# Patient Record
Sex: Female | Born: 1970 | Race: White | Hispanic: No | Smoking: Current every day smoker
Health system: Southern US, Community
[De-identification: ages and names within clinical notes are randomized; demographics above are authoritative.]

## PROBLEM LIST (undated history)

## (undated) DIAGNOSIS — E119 Type 2 diabetes mellitus without complications: Secondary | ICD-10-CM

## (undated) DIAGNOSIS — G629 Polyneuropathy, unspecified: Secondary | ICD-10-CM

## (undated) DIAGNOSIS — M797 Fibromyalgia: Secondary | ICD-10-CM

## (undated) DIAGNOSIS — E785 Hyperlipidemia, unspecified: Secondary | ICD-10-CM

## (undated) HISTORY — DX: Type 2 diabetes mellitus without complications: E11.9

## (undated) HISTORY — DX: Fibromyalgia: M79.7

## (undated) HISTORY — DX: Polyneuropathy, unspecified: G62.9

## (undated) HISTORY — DX: Hyperlipidemia, unspecified: E78.5

## (undated) HISTORY — PX: KNEE SURGERY: SHX244

---

## 2006-12-02 ENCOUNTER — Other Ambulatory Visit: Admission: RE | Admit: 2006-12-02 | Discharge: 2006-12-02 | Payer: Self-pay | Admitting: Obstetrics and Gynecology

## 2007-05-14 ENCOUNTER — Encounter: Admission: RE | Admit: 2007-05-14 | Discharge: 2007-05-14 | Payer: Self-pay | Admitting: Obstetrics and Gynecology

## 2007-06-19 ENCOUNTER — Inpatient Hospital Stay (HOSPITAL_COMMUNITY): Admission: RE | Admit: 2007-06-19 | Discharge: 2007-06-22 | Payer: Self-pay | Admitting: Obstetrics and Gynecology

## 2007-06-19 ENCOUNTER — Encounter (INDEPENDENT_AMBULATORY_CARE_PROVIDER_SITE_OTHER): Payer: Self-pay | Admitting: Obstetrics and Gynecology

## 2007-06-23 ENCOUNTER — Encounter: Admission: RE | Admit: 2007-06-23 | Discharge: 2007-07-23 | Payer: Self-pay | Admitting: Obstetrics and Gynecology

## 2007-07-24 ENCOUNTER — Encounter: Admission: RE | Admit: 2007-07-24 | Discharge: 2007-08-23 | Payer: Self-pay | Admitting: Obstetrics and Gynecology

## 2007-08-24 ENCOUNTER — Encounter: Admission: RE | Admit: 2007-08-24 | Discharge: 2007-09-22 | Payer: Self-pay | Admitting: Obstetrics and Gynecology

## 2007-09-23 ENCOUNTER — Encounter: Admission: RE | Admit: 2007-09-23 | Discharge: 2007-10-23 | Payer: Self-pay | Admitting: Obstetrics and Gynecology

## 2007-10-24 ENCOUNTER — Encounter: Admission: RE | Admit: 2007-10-24 | Discharge: 2007-11-22 | Payer: Self-pay | Admitting: Obstetrics and Gynecology

## 2007-11-23 ENCOUNTER — Encounter: Admission: RE | Admit: 2007-11-23 | Discharge: 2007-12-23 | Payer: Self-pay | Admitting: Obstetrics and Gynecology

## 2007-12-24 ENCOUNTER — Encounter: Admission: RE | Admit: 2007-12-24 | Discharge: 2008-01-23 | Payer: Self-pay | Admitting: Obstetrics and Gynecology

## 2008-01-24 ENCOUNTER — Encounter: Admission: RE | Admit: 2008-01-24 | Discharge: 2008-02-20 | Payer: Self-pay | Admitting: Obstetrics and Gynecology

## 2011-04-10 NOTE — H&P (Signed)
Victoria Mcdonald, CHAR              ACCOUNT NO.:  [id number]   MEDICAL RECORD NO.:  [id number]           PATIENT TYPE:   LOCATION:                                 FACILITY:   PHYSICIAN:  Charles A. Delcambre, MDDATE OF BIRTH:  [birth year]   DATE OF ADMISSION:  06/23/2007  DATE OF DISCHARGE:                              HISTORY & PHYSICAL   HISTORY OF PRESENT ILLNESS:  The patient is to be admitted on June 23, 2007 to undergo repeat cesarean section with prior cesarean section,  declining vaginal birth after cesarean section.  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 Father of the baby was negative. She gives informed consent,  accepts risks of infection, bleeding, bowel and bladder damage, blood  products risks including hepatitis B and HIV exposure and DVT, ureteral  damage, incisional infection. All questions are answered.   PREGNANCY LABORATORY DATA:  Platelets 310,000. Initial hemoglobin 15.6.  Blood type O+, antibody screen negative. VDRL non-reactive. Rubella  immune. Hepatitis B surface antigen negative. HIV non-reactive. Quad  screen negative. First trimester screen normal. One hour Glucola 107.  Hemoglobin 13.4 at 28 weeks. RPR negative at 28 weeks. HIV, GC, and  Chlamydia are negative at 36 weeks. Group B strep negative.   PAST MEDICAL HISTORY:  Irritable bowel syndrome, GERD, and advanced  maternal age declining amniocentesis.   PAST SURGICAL HISTORY:  Knee surgery, cesarean section, and induced  abortion first trimester.   MEDICATIONS:  Nexium 40 mg daily.   ALLERGIES:  IODINE, SULFA, MORPHINE.   SOCIAL HISTORY:  No tobacco, alcohol, or drug use. Married and in a  monogamous relationship with her husband.   FAMILY HISTORY:  Father with testicular cancer. Maternal grandfather  with colon cancer. Paternal grandmother with breast cancer. Mother and  father with hypertension. Paternal grandfather with heart disease,  diabetes.  Maternal aunt with thyroid problems, and her maternal  grandmother. Otherwise, no major illnesses.   REVIEW OF SYSTEMS:  Denies fever, chills, nausea, vomiting, blurred  vision, right upper quadrant pain, bowel or bladder changes other than  some incontinence consistent with pregnancy, and no rupture of membranes  or bleeding. Occasional contraction but not regular. She notes active  fetal movement. Denies vaginal bleeding.   PHYSICAL EXAMINATION:  GENERAL:  Alert and oriented times three. No  distress.  VITAL SIGNS:  Blood pressure 110/84, respiratory rate 18, pulse 90,  afebrile, weight 218 pounds.  HEENT:  Examination grossly within normal limits.  NECK:  Supple without thyromegaly or adenopathy.  LUNGS:  Clear bilaterally.  HEART:  Regular rate and rhythm with 2/6 systolic ejection murmur at the  left sternal border.  BREAST:  No mass, tenderness, or discharge.  SKIN:  No nipple change bilaterally.  ABDOMEN:  Gravid. Fundal height 42.  She has had macrosomic baby greater  than 97th percentile about 2 weeks ago. Will be checking again next week  for fluid as well as growth.  PELVIC:  Examination deferred.  EXTREMITIES:  Minimal edema bilaterally.   ASSESSMENT:  Pregnancy to be 39 weeks and 1 day,  to be admitted for  repeat cesarean section. She does not desire tubal ligation. All  questions are answered. She gives informed consent as noted above. Plan  preoperative labs to include type and screen, RPR and CBC will be drawn.  She will remain NPO past midnight. All questions are answered and we  will proceed as outlined.      Charles A. Sydnee Cabal, MD  Electronically Signed     CAD/MEDQ  D:  06/10/2007  T:  06/10/2007  Job:  [id number]

## 2011-04-10 NOTE — Op Note (Signed)
NAMENICHOLL, ONSTOTT              ACCOUNT NO.:  [id number]   MEDICAL RECORD NO.:  [id number]          PATIENT TYPE:  INP   LOCATION:  9198                          [facility]ILITY:  [facility]   PHYSICIAN:  Charles A. Delcambre, MDDATE OF BIRTH:  Mar 18, 1971   DATE OF PROCEDURE:  06/19/2007  DATE OF DISCHARGE:                               OPERATIVE REPORT   PREOPERATIVE DIAGNOSES:  1. Intrauterine pregnancy at 38 weeks 4 days.  2. Pregnancy associated hypertension.   POSTOPERATIVE DIAGNOSES:  1. Intrauterine pregnancy at 38 weeks 4 days.  2. Pregnancy associated hypertension.   PROCEDURE:  Repeat low transverse cesarean section.   SURGEON:  Charles A. Delcambre, MD.   ASSISTANT:  None.   COMPLICATIONS:  None.   BLOOD LOSS:  500 mL.   FINDINGS:  Three-vessel cord.  Normal uterus, tubes and ovaries.  Vigorous female, Apgars 9 and 9, nuchal cord x1.  Placenta to pathology.   ANESTHESIA:  Spinal.   DESCRIPTION OF PROCEDURE:  The patient was taken to the operating room  and placed in supine position after spinal anesthetic was induced.  Anesthesia was adequate and sterile prep and drape was undertaken.  A  Pfannenstiel incision over old scar from previous cesarean section was  cut with a knife and carried down to fascia.  Fascia was incised with a  knife and Mayo scissors.  The rectus sheath was released sharply and  with the Bovie superiorly and inferiorly without damage to surrounding  structures.  Rectus muscles were sharply dissected in the midline  without damage to the bladder.  Peritoneum was entered bluntly with  finger and traction was used to extend this incision.  Bladder blade was  placed.  Vesicouterine peritoneum was incised with Metzenbaum scissors  in a smiling fashion on the lower uterine segment.  Sharp dissection was  then used to develop the bladder flap.  There was no evidence of injury  to the bladder.  A scoring incision was then used to transverse on the  uterus  with the knife to amniotomy.  There was no damage to the infant.  Traction was used vertically to open the uterus.  Fundal pressure was  applied by the operator's assistant and the baby was delivered without  difficulty.  Nuchal cord was reduced and there was a spontaneous cry  upon delivery.  Cord was cut and infant was shown to the parents and  handed off to Dr. Alison Murray who was in attendance, neonatologist.  Placenta  was manually expressed.  Uterus was externalized and the internal  surface was wiped with dry lap.  The two-layer closure with #1 chromic  was undertaken, first running locking, second running imbricating over  nonlocking.  There was one area of bleeding to the left of the incision  that was closed with figure-of-eight 2-0 Vicryl and there was a figure-  of-eight suture placed across the incision in one area to achieve  hemostasis with #1 chromic.  Irrigation was carried out.  Hemostasis was  excellent.  Uterus had been externalized for repair and was re-  internalized before irrigation.  Uterine incision was  verified to be of  good hemostasis.  Bladder flap was of good hemostasis.  Subfascial  tissue needed a small amount of cautery superiorly to achieve  hemostasis.  Fascia was then closed with #1 Vicryl running nonlocking  suture.  The subcutaneous irrigation was carried out.  Hemostasis was  excellent.  Sterile skin staples were used to close the skin.  Sterile  dressing was applied.  Sterile skin clips were used to close the skin.  Sterile dressing was applied.  Patient was taken to recovery with  physician in attendance having tolerated the procedure well.      Charles A. Sydnee Cabal, MD  Electronically Signed     CAD/MEDQ  D:  06/19/2007  T:  06/20/2007  Job:  [id number]

## 2011-04-10 NOTE — Discharge Summary (Signed)
Victoria Mcdonald, Victoria Mcdonald              ACCOUNT NO.:  [id number]   MEDICAL RECORD NO.:  [id number]          PATIENT TYPE:  INP   LOCATION:  9129                          [facility]ILITY:  [facility]   PHYSICIAN:  Charles A. Delcambre, MDDATE OF BIRTH:  1971-02-16   DATE OF ADMISSION:  06/19/2007  DATE OF DISCHARGE:  06/22/2007                               DISCHARGE SUMMARY   DISCHARGE DIAGNOSIS:  1. Intrauterine pregnancy at 39 weeks 4 days.  2. Previous cesarean section, desiring repeat cesarean section.  3. Pregnancy-associated hypertension, not meeting criteria for      preeclampsia   PROCEDURE:  Repeat low transverse cesarean section.   DISPOSITION:  The patient was discharged home to follow up in the office  on the next day to remove staples.  She is given convalescent  instructions.   PRESCRIPTIONS:  1. Percocet one two p.o. q.4 h.,#30  2. Motrin 800 mg one p.o. q. 8 h p.r.n., #30 with 1 refill.   She was to convalescent to home. Notify of temperature greater than 100  degrees, incisional redness or drainage, increased pain or bleeding.  No  driving for 2 weeks.  Shower only for 2 weeks.  No vaginal penetration  for 6 weeks. No lifting greater than 30 pounds for 4 weeks.   LABORATORY:  Postoperative hemoglobin 11.5, hematocrit 33.1.   HISTORY AND PHYSICAL:  Dictated and on the chart.   HOSPITAL COURSE:  The patient was admitted, underwent surgery as noted  above.  She was admitted before planned 39 weeks at 38-week 4 days  secondary to blood pressures in the 140s/90s, increasing over the last  several days.  She had negative pregnancy-induced hypertension  laboratory panel on day prior to admission.  For these reasons, C-  section was moved up from 39 weeks. Placenta pathology.  Baby was  vigorous, Apgars 9/9.  Weight 3615 grams, 57 cm, and female.   HOSPITAL COURSE:  The patient was admitted and went to surgery as noted  above.  Postoperatively, she had some pain difficulty, and PCA  was  administered. She had flatus return postop day #1, voided without  difficulty.  She was given general diet.  Postop day #2, she had some cough, but this had improved. She continued  to do well and was on a general diet,  ambulating, voiding, and pain was controlled on p.o. medications.  Postop day #3, she was discharged home.  Continued to be stable. Staples  to be discontinued at the office the next day. Blood pressure on day of  admission was 149/79.      Charles A. Sydnee Cabal, MD  Electronically Signed     CAD/MEDQ  D:  07/12/2007  T:  07/13/2007  Job:  [id number]

## 2011-09-10 LAB — CBC
HCT: 33.1 — ABNORMAL LOW
HCT: 42.2
Hemoglobin: 11.5 — ABNORMAL LOW
Hemoglobin: 14.4
MCHC: 34
MCHC: 34.8
MCV: 94.2
MCV: 94.9
Platelets: 177
Platelets: 269
RBC: 3.52 — ABNORMAL LOW
RBC: 4.45
RDW: 13.6
RDW: 13.7
WBC: 10.1
WBC: 12.9 — ABNORMAL HIGH

## 2011-09-10 LAB — TYPE AND SCREEN
ABO/RH(D): O POS
Antibody Screen: NEGATIVE

## 2011-09-10 LAB — ABO/RH: ABO/RH(D): O POS

## 2011-09-10 LAB — SYPHILIS: RPR W/REFLEX TO RPR TITER AND TREPONEMAL ANTIBODIES, TRADITIONAL SCREENING AND DIAGNOSIS ALGORITHM: RPR Ser Ql: NONREACTIVE

## 2012-03-05 ENCOUNTER — Other Ambulatory Visit (HOSPITAL_COMMUNITY): Payer: Self-pay | Admitting: Family Medicine

## 2012-03-05 DIAGNOSIS — Z139 Encounter for screening, unspecified: Secondary | ICD-10-CM

## 2012-03-10 ENCOUNTER — Ambulatory Visit (HOSPITAL_COMMUNITY): Payer: Self-pay

## 2012-05-19 ENCOUNTER — Other Ambulatory Visit (HOSPITAL_COMMUNITY): Payer: Self-pay | Admitting: Family Medicine

## 2012-05-19 DIAGNOSIS — Z139 Encounter for screening, unspecified: Secondary | ICD-10-CM

## 2012-05-22 ENCOUNTER — Ambulatory Visit (HOSPITAL_COMMUNITY): Payer: Self-pay

## 2012-05-30 ENCOUNTER — Ambulatory Visit (HOSPITAL_COMMUNITY)
Admission: RE | Admit: 2012-05-30 | Discharge: 2012-05-30 | Disposition: A | Discharge status: Home / Self Care | Payer: BC Managed Care – PPO | Source: Ambulatory Visit | Attending: Family Medicine | Admitting: Family Medicine

## 2012-05-30 DIAGNOSIS — Z139 Encounter for screening, unspecified: Secondary | ICD-10-CM

## 2012-05-30 DIAGNOSIS — Z1231 Encounter for screening mammogram for malignant neoplasm of breast: Secondary | ICD-10-CM | POA: Insufficient documentation

## 2012-06-04 ENCOUNTER — Other Ambulatory Visit: Payer: Self-pay | Admitting: Family Medicine

## 2012-06-04 DIAGNOSIS — R928 Other abnormal and inconclusive findings on diagnostic imaging of breast: Secondary | ICD-10-CM

## 2012-06-25 ENCOUNTER — Ambulatory Visit (HOSPITAL_COMMUNITY)
Admission: RE | Admit: 2012-06-25 | Discharge: 2012-06-25 | Disposition: A | Discharge status: Home / Self Care | Payer: BC Managed Care – PPO | Source: Ambulatory Visit | Attending: Family Medicine | Admitting: Family Medicine

## 2012-06-25 DIAGNOSIS — R928 Other abnormal and inconclusive findings on diagnostic imaging of breast: Secondary | ICD-10-CM | POA: Insufficient documentation

## 2013-05-04 ENCOUNTER — Encounter: Payer: Self-pay | Admitting: Diagnostic Neuroimaging

## 2013-05-04 ENCOUNTER — Ambulatory Visit (INDEPENDENT_AMBULATORY_CARE_PROVIDER_SITE_OTHER): Payer: BC Managed Care – PPO | Admitting: Diagnostic Neuroimaging

## 2013-05-04 VITALS — BP 136/89 | HR 120 | Ht 67.0 in | Wt 208.0 lb

## 2013-05-04 DIAGNOSIS — E1149 Type 2 diabetes mellitus with other diabetic neurological complication: Secondary | ICD-10-CM

## 2013-05-04 DIAGNOSIS — E1142 Type 2 diabetes mellitus with diabetic polyneuropathy: Secondary | ICD-10-CM

## 2013-05-04 DIAGNOSIS — E1140 Type 2 diabetes mellitus with diabetic neuropathy, unspecified: Secondary | ICD-10-CM | POA: Insufficient documentation

## 2013-05-04 MED ORDER — PREGABALIN 150 MG PO CAPS: 150.0000 mg | ORAL_CAPSULE | Freq: Three times a day (TID) | ORAL | Status: DC

## 2013-05-04 NOTE — Patient Instructions (Signed)
Increase Lyrica to 150 mg 3 times per day.

## 2013-05-04 NOTE — Progress Notes (Signed)
[facility]  PATIENT: Victoria Mcdonald DOB: [birth year]  REFERRING CLINICIAN:  HISTORY FROM: patient and husband REASON FOR VISIT: new consult   HISTORICAL  CHIEF COMPLAINT:  Chief Complaint  Patient presents with  . Follow-up    #6    HISTORY OF PRESENT ILLNESS:   UPDATE 05/04/13: since last visit patient has increase Lyrica to 100 mg 3 times a day with mild relief. Now developing some numbness and burning in her hands. She's not had followup for diabetes and while. She's having difficulty with managing her diet and exercise. She continues to smoke cigarettes. 7 people/family living at home. She cares for her 5 year son with autism, which is stressful.  PRIOR HPI: 42 year old right-handed female with history of diabetes, chronic pain, fibromyalgia, here for evaluation of neuropathy.  2008, patient developed numbness, burning, tingling sensation in her toes. This progressed to involve her bilateral feet up to her ankles. Approximately one year ago she started to have similar symptoms in her fingertips and hands. She was evaluated by a neurologist, who performed EMG nerve conduction study, and diagnosed peripheral neuropathy. No specific cause was found. Recently patient has been diagnosed with diabetes.  Patient was tried on gabapentin and gralise, without relief. She's tried on Lyrica 50 mg 3 times a day which provided some initial relief, but now symptoms are progressing. Patient also has pain in her hips, thighs, arms.  REVIEW OF SYSTEMS: Full 14 system review of systems performed and notable only for fatigue feeling hot aching muscles headache.  ALLERGIES: Not on File  HOME MEDICATIONS: No outpatient prescriptions prior to visit.   No facility-administered medications prior to visit.    PAST MEDICAL HISTORY: Past Medical History  Diagnosis Date  . Diabetes   . Fibromyalgia     PAST SURGICAL HISTORY: Past Surgical History  Procedure Laterality Date    . Knee surgery      FAMILY HISTORY: Family History  Problem Relation Age of Onset  . Prostate cancer Father   . Diabetes      Maternal    SOCIAL HISTORY:  History   Social History  . Marital Status: Divorced    Spouse Name: N/A    Number of Children: 2  . Years of Education: 12th   Occupational History  .      Self-Employed   Social History Main Topics  . Smoking status: Current Every Day Smoker -- 1.00 packs/day  . Smokeless tobacco: Never Used  . Alcohol Use: No  . Drug Use: No  . Sexually Active: Not on file   Other Topics Concern  . Not on file   Social History Narrative   Pt lives at home with her family.   Caffeine Use: 1 cup of coffee daily.      PHYSICAL EXAM  Filed Vitals:   05/04/13 1510  BP: 136/89  Pulse: 120  Height: 5\' 7"  (1.702 m)  Weight: 208 lb (94.348 kg)    Not recorded    Body mass index is 32.57 kg/(m^2).  EXAM: General: Patient is awake, alert and in no acute distress.  Well developed and groomed.  Neck: Neck is supple. Cardiovascular: No carotid artery bruits.  Heart is regular rate and rhythm with no murmurs.  Neurologic Exam  Mental Status: Awake, alert. Language is fluent and comprehension intact. Cranial Nerves: No evidence of papilledema on funduscopic exam.  Pupils are equal and reactive to light.  Visual fields are full to confrontation.  Conjugate eye movements  are full and symmetric.  Facial sensation and strength are symmetric.  Hearing is intact.  Palate elevated symmetrically and uvula is midline.  Shoulder shrug is symmetric.  Tongue is midline. Motor: Normal bulk and tone.  Full strength in the upper and lower extremities.  No pronator drift. Sensory: DECR PP IN FEET. VIB NL. Coordination: No ataxia or dysmetria on finger-nose or rapid alternating movement testing. Gait and Station: SLOW, ANTALGIC MOVEMENTS.Narrow based gait.  Tandem gait is stable.  Romberg is negative. Reflexes: BUE 1, KNEES 2, ANKLES  TRACE.   DIAGNOSTIC DATA (LABS, IMAGING, TESTING) - I reviewed patient records, labs, notes, testing and imaging myself where available.  Lab Results  Component Value Date   WBC 10.1 06/20/2007   HGB 11.5 DELTA CHECK NOTED* 06/20/2007   HCT 33.1* 06/20/2007   MCV 94.2 06/20/2007   PLT 177 DELTA CHECK NOTED 06/20/2007   No results found for this basename: na, k, cl, co2, glucose, bun, creatinine, calcium, prot, albumin, ast, alt, alkphos, bilitot, gfrnonaa, gfraa   No results found for this basename: CHOL, HDL, LDLCALC, LDLDIRECT, TRIG, CHOLHDL   No results found for this basename: HGBA1C   No results found for this basename: VITAMINB12   No results found for this basename: TSH    12/01/12 B12 652  12/01/12 TSH 1.460   ASSESSMENT AND PLAN  42 y.o. year old female  has a past medical history of Diabetes and Fibromyalgia. here with diabetic neuropathy.  PLAN: 1. Increase Lyrica to 150 mg 3 times per day 2. Trial of compounded neuropathy cream 3. Continue Cymbalta   Suanne Marker, MD 05/04/2013, 3:39 PM Certified in Neurology, Neurophysiology and Neuroimaging  [facility] [address], [address] [place] [facility] [place], [place] [address] [phone]

## 2014-01-13 ENCOUNTER — Ambulatory Visit: Payer: BC Managed Care – PPO | Admitting: Diagnostic Neuroimaging

## 2014-10-28 ENCOUNTER — Other Ambulatory Visit (HOSPITAL_COMMUNITY): Payer: Self-pay | Admitting: Family Medicine

## 2014-10-28 DIAGNOSIS — Z1231 Encounter for screening mammogram for malignant neoplasm of breast: Secondary | ICD-10-CM

## 2014-11-01 ENCOUNTER — Ambulatory Visit (HOSPITAL_COMMUNITY): Payer: BC Managed Care – PPO

## 2015-09-07 ENCOUNTER — Encounter: Payer: Self-pay | Admitting: Diagnostic Neuroimaging

## 2015-09-07 ENCOUNTER — Ambulatory Visit (INDEPENDENT_AMBULATORY_CARE_PROVIDER_SITE_OTHER): Payer: BLUE CROSS/BLUE SHIELD | Admitting: Diagnostic Neuroimaging

## 2015-09-07 VITALS — BP 117/78 | HR 102 | Ht 67.0 in | Wt 197.0 lb

## 2015-09-07 DIAGNOSIS — E1142 Type 2 diabetes mellitus with diabetic polyneuropathy: Secondary | ICD-10-CM

## 2015-09-07 NOTE — Progress Notes (Signed)
[facility]  PATIENT: Victoria Mcdonald DOB: 09-03-1971  REFERRING CLINICIAN:  HISTORY FROM: patient REASON FOR VISIT: follow up   HISTORICAL  CHIEF COMPLAINT:  Chief Complaint  Patient presents with  . Diabetic neuropathy    rm 6, last visit 04/2013    HISTORY OF PRESENT ILLNESS:   UPDATE 09/07/15: Since last visit, went to pain mgmt (Dr. Eduard ClosBethea) and tried nortriptyline + duloxetine, which greatly helped. Now in last 3 months, pain in hands increasing.   UPDATE 05/04/13: since last visit patient has increase Lyrica to 100 mg 3 times a day with mild relief. Now developing some numbness and burning in her hands. She's not had followup for diabetes and while. She's having difficulty with managing her diet and exercise. She continues to smoke cigarettes. 7 people/family living at home. She cares for her 5 year son with autism, which is stressful.  PRIOR HPI: 44 year old right-handed female-handed female with history of diabetes, chronic pain, fibromyalgia, here for evaluation of neuropathy. 2008, patient developed numbness, burning, tingling sensation in her toes. This progressed to involve her bilateral feet up to her ankles. Approximately one year ago she started to have similar symptoms in her fingertips and hands. She was evaluated by a neurologist, who performed EMG nerve conduction study, and diagnosed peripheral neuropathy. No specific cause was found. Recently patient has been diagnosed with diabetes. Patient was tried on gabapentin and gralise, without relief. She's tried on Lyrica 50 mg 3 times a day which provided some initial relief, but now symptoms are progressing. Patient also has pain in her hips, thighs, arms.   REVIEW OF SYSTEMS: Full 14 system review of systems performed and notable only for fatigue feeling hot aching muscles headache.  ALLERGIES: Not on File  HOME MEDICATIONS: Outpatient Prescriptions Prior to Visit  Medication Sig Dispense Refill  . DULoxetine  (CYMBALTA) 60 MG capsule Take 1 capsule by mouth daily.    . hydrochlorothiazide (HYDRODIURIL) 25 MG tablet Take by mouth. Patient takes 1/2 tab daily.    . promethazine (PHENERGAN) 25 MG tablet Take 1 tablet by mouth daily.    . traMADol (ULTRAM) 50 MG tablet Take 1 tablet by mouth daily.    Marland Kitchen. ALPRAZolam (XANAX) 0.5 MG tablet Take 1 tablet by mouth as needed.    Marland Kitchen. HYDROcodone-acetaminophen (NORCO/VICODIN) 5-325 MG per tablet as needed.     . metFORMIN (GLUCOPHAGE) 500 MG tablet Take 1 tablet by mouth daily.    . pregabalin (LYRICA) 150 MG capsule Take 1 capsule (150 mg total) by mouth 3 (three) times daily. 90 capsule 12   No facility-administered medications prior to visit.    PAST MEDICAL HISTORY: Past Medical History  Diagnosis Date  . Diabetes (HCC)   . Fibromyalgia     PAST SURGICAL HISTORY: Past Surgical History  Procedure Laterality Date  . Knee surgery      FAMILY HISTORY: Family History  Problem Relation Age of Onset  . Prostate cancer Father   . Diabetes      Maternal    SOCIAL HISTORY:  Social History   Social History  . Marital Status: Divorced    Spouse Name: N/A  . Number of Children: 2  . Years of Education: 12th   Occupational History  .      Self-Employed   Social History Main Topics  . Smoking status: Current Every Day Smoker -- 1.00 packs/day for 30 years  . Smokeless tobacco: Never Used  . Alcohol Use: No  .  Drug Use: No  . Sexual Activity: Not on file   Other Topics Concern  . Not on file   Social History Narrative   Pt lives at home with her family.   Caffeine Use: 1 cup of coffee daily.      PHYSICAL EXAM  Filed Vitals:   09/07/15 1347  BP: 117/78  Pulse: 102  Height:  (1.702 m)  Weight: 197 lb (89.359 kg)    Not recorded      Body mass index is 30.85 kg/(m^2).  EXAM: General: Patient is awake, alert and in no acute distress.  Well developed and groomed.  Neck: Neck is supple. Cardiovascular: No carotid  artery bruits.  Heart is regular rate and rhythm with no murmurs.  Neurologic Exam  Mental Status: Awake, alert. Language is fluent and comprehension intact. Cranial Nerves: No evidence of papilledema on funduscopic exam.  Pupils are equal and reactive to light.  Visual fields are full to confrontation.  Conjugate eye movements are full and symmetric.  Facial sensation and strength are symmetric.  Hearing is intact.  Palate elevated symmetrically and uvula is midline.  Shoulder shrug is symmetric.  Tongue is midline. Motor: Normal bulk and tone.  Full strength in the upper and lower extremities.  No pronator drift. Sensory: DECR PP IN FEET. ABSENT VIBRATION AT TOES. Coordination: No ataxia or dysmetria on finger-nose or rapid alternating movement testing. Reflexes: BUE 1, KNEES 2, ANKLES TRACE. Gait and Station: Narrow based gait.  Tandem gait is stable.  Romberg is negative.    DIAGNOSTIC DATA (LABS, IMAGING, TESTING) - I reviewed patient records, labs, notes, testing and imaging myself where available.  Lab Results  Component Value Date   WBC 10.1 06/20/2007   HGB 11.5 DELTA CHECK NOTED* 06/20/2007   HCT 33.1* 06/20/2007   MCV 94.2 06/20/2007   PLT 177 DELTA CHECK NOTED 06/20/2007   No results found for: NA No results found for: CHOL No results found for: HGBA1C No results found for: VITAMINB12 No results found for: TSH  12/01/12 B12 652  12/01/12 TSH 1.460   ASSESSMENT AND PLAN  44 year old female  has a past medical history of Diabetes (HCC) and Fibromyalgia. here with diabetic neuropathy.  Dx: Diabetic polyneuropathy associated with type 2 diabetes mellitus (HCC)   PLAN: 1. Continue nortriptyline and duloxetine for neuropathy 2. Continue hydrocodone / APAP and tramadol for other chronic pain  Return if symptoms worsen or fail to improve, for return to PCP.     Suanne Marker, MD 09/07/2015, 2:03 PM Certified in Neurology, Neurophysiology and  Neuroimaging  [facility] [address], [address] [place], [place] [address] [phone]

## 2015-09-07 NOTE — Patient Instructions (Signed)
Thank you for coming to see [place] at [facility]. I hope we have been able to provide you high quality care today.  You may receive a patient satisfaction survey over the next few weeks. We would appreciate your feedback and comments so that we may continue to improve ourselves and the health of our patients.  - continue current medications - keep up good work with diabetes control!   ~~~~~~~~~~~~~~~~~~~~~~~~~~~~~~~~~~~~~~~~~~~~~~~~~~~~~~~~~~~~~~~~~  DR. Jacinda Kanady'S GUIDE TO HAPPY AND HEALTHY LIVING These are some of my general health and wellness recommendations. Some of them may apply to you better than others. Please use common sense as you try these suggestions and feel free to ask me any questions.   ACTIVITY/FITNESS Mental, social, emotional and physical stimulation are very important for brain and body health. Try learning a new activity (arts, music, language, sports, games).  Keep moving your body to the best of your abilities. You can do this at home, inside or outside, the park, community center, gym or anywhere you like. Consider a physical therapist or personal trainer to get started. Consider the app Sworkit. Fitness trackers such as smart-watches, smart-phones or Fitbits can help as well.   NUTRITION Eat more plants: colorful vegetables, nuts, seeds and berries.  Eat less sugar, salt, preservatives and processed foods.  Avoid toxins such as cigarettes and alcohol.  Drink water when you are thirsty. Warm water with a slice of lemon is an excellent morning drink to start the day.  Consider these websites for more information The Nutrition Source (https://www.henry-hernandez.biz/) [employer] Nutrition (WindowBlog.ch)   RELAXATION Consider practicing mindfulness meditation or other relaxation techniques such as deep breathing, prayer, yoga, tai chi, massage. See website mindful.org or the apps Headspace or Calm to  help get started.   SLEEP Try to get at least 7-8+ hours sleep per day. Regular exercise and reduced caffeine will help you sleep better. Practice good sleep hygeine techniques. See website sleep.org for more information.   PLANNING Prepare estate planning, living will, healthcare POA documents. Sometimes this is best planned with the help of an attorney. Theconversationproject.org and agingwithdignity.org are excellent resources.

## 2016-02-28 DIAGNOSIS — E1142 Type 2 diabetes mellitus with diabetic polyneuropathy: Secondary | ICD-10-CM | POA: Diagnosis not present

## 2016-02-28 DIAGNOSIS — B351 Tinea unguium: Secondary | ICD-10-CM | POA: Diagnosis not present

## 2016-02-28 DIAGNOSIS — L851 Acquired keratosis [keratoderma] palmaris et plantaris: Secondary | ICD-10-CM | POA: Diagnosis not present

## 2016-03-08 DIAGNOSIS — E782 Mixed hyperlipidemia: Secondary | ICD-10-CM | POA: Diagnosis not present

## 2016-03-08 DIAGNOSIS — E1140 Type 2 diabetes mellitus with diabetic neuropathy, unspecified: Secondary | ICD-10-CM | POA: Diagnosis not present

## 2016-03-08 DIAGNOSIS — I10 Essential (primary) hypertension: Secondary | ICD-10-CM | POA: Diagnosis not present

## 2016-03-08 DIAGNOSIS — Z1389 Encounter for screening for other disorder: Secondary | ICD-10-CM | POA: Diagnosis not present

## 2016-03-08 DIAGNOSIS — E6609 Other obesity due to excess calories: Secondary | ICD-10-CM | POA: Diagnosis not present

## 2016-03-08 DIAGNOSIS — Z6831 Body mass index (BMI) 31.0-31.9, adult: Secondary | ICD-10-CM | POA: Diagnosis not present

## 2016-04-10 ENCOUNTER — Telehealth: Payer: Self-pay | Admitting: Diagnostic Neuroimaging

## 2016-04-10 NOTE — Telephone Encounter (Signed)
Victoria Mcdonald  [phone]  [facility], CHECKING ON THE REQUEST FOR HER MEDICAL RECORDS,HAS HEARING Glenice LaineSOON Y

## 2016-04-18 ENCOUNTER — Telehealth: Payer: Self-pay | Admitting: *Deleted

## 2016-04-18 NOTE — Telephone Encounter (Signed)
Waiting on the release form.

## 2016-04-18 NOTE — Telephone Encounter (Signed)
Left message, need a release and a fax #.

## 2016-05-15 DIAGNOSIS — L851 Acquired keratosis [keratoderma] palmaris et plantaris: Secondary | ICD-10-CM | POA: Diagnosis not present

## 2016-05-15 DIAGNOSIS — E1142 Type 2 diabetes mellitus with diabetic polyneuropathy: Secondary | ICD-10-CM | POA: Diagnosis not present

## 2016-05-15 DIAGNOSIS — B351 Tinea unguium: Secondary | ICD-10-CM | POA: Diagnosis not present

## 2016-06-01 DIAGNOSIS — Z6830 Body mass index (BMI) 30.0-30.9, adult: Secondary | ICD-10-CM | POA: Diagnosis not present

## 2016-06-01 DIAGNOSIS — E782 Mixed hyperlipidemia: Secondary | ICD-10-CM | POA: Diagnosis not present

## 2016-06-01 DIAGNOSIS — E1140 Type 2 diabetes mellitus with diabetic neuropathy, unspecified: Secondary | ICD-10-CM | POA: Diagnosis not present

## 2016-06-01 DIAGNOSIS — Z1389 Encounter for screening for other disorder: Secondary | ICD-10-CM | POA: Diagnosis not present

## 2016-06-01 DIAGNOSIS — I10 Essential (primary) hypertension: Secondary | ICD-10-CM | POA: Diagnosis not present

## 2016-06-15 DIAGNOSIS — E782 Mixed hyperlipidemia: Secondary | ICD-10-CM | POA: Diagnosis not present

## 2016-06-15 DIAGNOSIS — Z6831 Body mass index (BMI) 31.0-31.9, adult: Secondary | ICD-10-CM | POA: Diagnosis not present

## 2016-06-15 DIAGNOSIS — Z1389 Encounter for screening for other disorder: Secondary | ICD-10-CM | POA: Diagnosis not present

## 2016-06-22 ENCOUNTER — Other Ambulatory Visit: Payer: Self-pay | Admitting: Family Medicine

## 2016-06-22 DIAGNOSIS — Z1231 Encounter for screening mammogram for malignant neoplasm of breast: Secondary | ICD-10-CM

## 2016-07-03 DIAGNOSIS — L97512 Non-pressure chronic ulcer of other part of right foot with fat layer exposed: Secondary | ICD-10-CM | POA: Diagnosis not present

## 2016-07-03 DIAGNOSIS — E1142 Type 2 diabetes mellitus with diabetic polyneuropathy: Secondary | ICD-10-CM | POA: Diagnosis not present

## 2016-07-17 DIAGNOSIS — L97519 Non-pressure chronic ulcer of other part of right foot with unspecified severity: Secondary | ICD-10-CM | POA: Diagnosis not present

## 2016-07-17 DIAGNOSIS — E1142 Type 2 diabetes mellitus with diabetic polyneuropathy: Secondary | ICD-10-CM | POA: Diagnosis not present

## 2016-07-24 DIAGNOSIS — L851 Acquired keratosis [keratoderma] palmaris et plantaris: Secondary | ICD-10-CM | POA: Diagnosis not present

## 2016-07-24 DIAGNOSIS — E1142 Type 2 diabetes mellitus with diabetic polyneuropathy: Secondary | ICD-10-CM | POA: Diagnosis not present

## 2016-07-24 DIAGNOSIS — B351 Tinea unguium: Secondary | ICD-10-CM | POA: Diagnosis not present

## 2016-08-16 DIAGNOSIS — Z6830 Body mass index (BMI) 30.0-30.9, adult: Secondary | ICD-10-CM | POA: Diagnosis not present

## 2016-08-16 DIAGNOSIS — G894 Chronic pain syndrome: Secondary | ICD-10-CM | POA: Diagnosis not present

## 2016-08-16 DIAGNOSIS — Z1389 Encounter for screening for other disorder: Secondary | ICD-10-CM | POA: Diagnosis not present

## 2016-08-16 DIAGNOSIS — E6609 Other obesity due to excess calories: Secondary | ICD-10-CM | POA: Diagnosis not present

## 2016-08-16 DIAGNOSIS — F419 Anxiety disorder, unspecified: Secondary | ICD-10-CM | POA: Diagnosis not present

## 2016-08-28 DIAGNOSIS — S8981XA Other specified injuries of right lower leg, initial encounter: Secondary | ICD-10-CM | POA: Diagnosis not present

## 2016-08-28 DIAGNOSIS — Z6830 Body mass index (BMI) 30.0-30.9, adult: Secondary | ICD-10-CM | POA: Diagnosis not present

## 2016-08-28 DIAGNOSIS — E6609 Other obesity due to excess calories: Secondary | ICD-10-CM | POA: Diagnosis not present

## 2016-09-07 DIAGNOSIS — M7061 Trochanteric bursitis, right hip: Secondary | ICD-10-CM | POA: Diagnosis not present

## 2016-09-07 DIAGNOSIS — M5431 Sciatica, right side: Secondary | ICD-10-CM | POA: Diagnosis not present

## 2016-09-07 DIAGNOSIS — Z6829 Body mass index (BMI) 29.0-29.9, adult: Secondary | ICD-10-CM | POA: Diagnosis not present

## 2016-09-07 DIAGNOSIS — E1140 Type 2 diabetes mellitus with diabetic neuropathy, unspecified: Secondary | ICD-10-CM | POA: Diagnosis not present

## 2016-09-07 DIAGNOSIS — G894 Chronic pain syndrome: Secondary | ICD-10-CM | POA: Diagnosis not present

## 2016-09-16 DIAGNOSIS — L237 Allergic contact dermatitis due to plants, except food: Secondary | ICD-10-CM | POA: Diagnosis not present

## 2016-10-02 DIAGNOSIS — E1142 Type 2 diabetes mellitus with diabetic polyneuropathy: Secondary | ICD-10-CM | POA: Diagnosis not present

## 2016-10-02 DIAGNOSIS — B351 Tinea unguium: Secondary | ICD-10-CM | POA: Diagnosis not present

## 2016-10-02 DIAGNOSIS — L97511 Non-pressure chronic ulcer of other part of right foot limited to breakdown of skin: Secondary | ICD-10-CM | POA: Diagnosis not present

## 2016-10-02 DIAGNOSIS — L851 Acquired keratosis [keratoderma] palmaris et plantaris: Secondary | ICD-10-CM | POA: Diagnosis not present

## 2016-10-11 DIAGNOSIS — Z23 Encounter for immunization: Secondary | ICD-10-CM | POA: Diagnosis not present

## 2016-10-16 DIAGNOSIS — L03031 Cellulitis of right toe: Secondary | ICD-10-CM | POA: Diagnosis not present

## 2016-10-16 DIAGNOSIS — L97511 Non-pressure chronic ulcer of other part of right foot limited to breakdown of skin: Secondary | ICD-10-CM | POA: Diagnosis not present

## 2016-10-16 DIAGNOSIS — E1142 Type 2 diabetes mellitus with diabetic polyneuropathy: Secondary | ICD-10-CM | POA: Diagnosis not present

## 2016-10-23 DIAGNOSIS — E1142 Type 2 diabetes mellitus with diabetic polyneuropathy: Secondary | ICD-10-CM | POA: Diagnosis not present

## 2016-10-23 DIAGNOSIS — L97511 Non-pressure chronic ulcer of other part of right foot limited to breakdown of skin: Secondary | ICD-10-CM | POA: Diagnosis not present

## 2016-11-06 DIAGNOSIS — E1142 Type 2 diabetes mellitus with diabetic polyneuropathy: Secondary | ICD-10-CM | POA: Diagnosis not present

## 2016-11-06 DIAGNOSIS — L97519 Non-pressure chronic ulcer of other part of right foot with unspecified severity: Secondary | ICD-10-CM | POA: Diagnosis not present

## 2016-11-29 DIAGNOSIS — E119 Type 2 diabetes mellitus without complications: Secondary | ICD-10-CM | POA: Diagnosis not present

## 2016-11-29 DIAGNOSIS — Z1389 Encounter for screening for other disorder: Secondary | ICD-10-CM | POA: Diagnosis not present

## 2016-11-29 DIAGNOSIS — E782 Mixed hyperlipidemia: Secondary | ICD-10-CM | POA: Diagnosis not present

## 2016-11-29 DIAGNOSIS — Z6830 Body mass index (BMI) 30.0-30.9, adult: Secondary | ICD-10-CM | POA: Diagnosis not present

## 2016-11-29 DIAGNOSIS — I10 Essential (primary) hypertension: Secondary | ICD-10-CM | POA: Diagnosis not present

## 2016-11-30 DIAGNOSIS — L97511 Non-pressure chronic ulcer of other part of right foot limited to breakdown of skin: Secondary | ICD-10-CM | POA: Diagnosis not present

## 2016-11-30 DIAGNOSIS — E1342 Other specified diabetes mellitus with diabetic polyneuropathy: Secondary | ICD-10-CM | POA: Diagnosis not present

## 2016-12-04 DIAGNOSIS — H52223 Regular astigmatism, bilateral: Secondary | ICD-10-CM | POA: Diagnosis not present

## 2016-12-04 DIAGNOSIS — H5201 Hypermetropia, right eye: Secondary | ICD-10-CM | POA: Diagnosis not present

## 2016-12-04 DIAGNOSIS — H524 Presbyopia: Secondary | ICD-10-CM | POA: Diagnosis not present

## 2016-12-04 DIAGNOSIS — H5212 Myopia, left eye: Secondary | ICD-10-CM | POA: Diagnosis not present

## 2016-12-11 DIAGNOSIS — B351 Tinea unguium: Secondary | ICD-10-CM | POA: Diagnosis not present

## 2016-12-11 DIAGNOSIS — E1142 Type 2 diabetes mellitus with diabetic polyneuropathy: Secondary | ICD-10-CM | POA: Diagnosis not present

## 2016-12-11 DIAGNOSIS — L851 Acquired keratosis [keratoderma] palmaris et plantaris: Secondary | ICD-10-CM | POA: Diagnosis not present

## 2017-01-15 DIAGNOSIS — L97511 Non-pressure chronic ulcer of other part of right foot limited to breakdown of skin: Secondary | ICD-10-CM | POA: Diagnosis not present

## 2017-01-15 DIAGNOSIS — E1342 Other specified diabetes mellitus with diabetic polyneuropathy: Secondary | ICD-10-CM | POA: Diagnosis not present

## 2017-01-29 DIAGNOSIS — L97511 Non-pressure chronic ulcer of other part of right foot limited to breakdown of skin: Secondary | ICD-10-CM | POA: Diagnosis not present

## 2017-01-29 DIAGNOSIS — E1142 Type 2 diabetes mellitus with diabetic polyneuropathy: Secondary | ICD-10-CM | POA: Diagnosis not present

## 2017-02-15 DIAGNOSIS — Z6831 Body mass index (BMI) 31.0-31.9, adult: Secondary | ICD-10-CM | POA: Diagnosis not present

## 2017-02-15 DIAGNOSIS — I10 Essential (primary) hypertension: Secondary | ICD-10-CM | POA: Diagnosis not present

## 2017-02-15 DIAGNOSIS — E782 Mixed hyperlipidemia: Secondary | ICD-10-CM | POA: Diagnosis not present

## 2017-02-15 DIAGNOSIS — Z1389 Encounter for screening for other disorder: Secondary | ICD-10-CM | POA: Diagnosis not present

## 2017-02-15 DIAGNOSIS — E1140 Type 2 diabetes mellitus with diabetic neuropathy, unspecified: Secondary | ICD-10-CM | POA: Diagnosis not present

## 2017-03-01 DIAGNOSIS — L97512 Non-pressure chronic ulcer of other part of right foot with fat layer exposed: Secondary | ICD-10-CM | POA: Diagnosis not present

## 2017-03-01 DIAGNOSIS — E1142 Type 2 diabetes mellitus with diabetic polyneuropathy: Secondary | ICD-10-CM | POA: Diagnosis not present

## 2017-03-08 DIAGNOSIS — E1142 Type 2 diabetes mellitus with diabetic polyneuropathy: Secondary | ICD-10-CM | POA: Diagnosis not present

## 2017-03-08 DIAGNOSIS — L97512 Non-pressure chronic ulcer of other part of right foot with fat layer exposed: Secondary | ICD-10-CM | POA: Diagnosis not present

## 2017-04-02 DIAGNOSIS — L97511 Non-pressure chronic ulcer of other part of right foot limited to breakdown of skin: Secondary | ICD-10-CM | POA: Diagnosis not present

## 2017-04-02 DIAGNOSIS — E1142 Type 2 diabetes mellitus with diabetic polyneuropathy: Secondary | ICD-10-CM | POA: Diagnosis not present

## 2017-04-02 DIAGNOSIS — L97521 Non-pressure chronic ulcer of other part of left foot limited to breakdown of skin: Secondary | ICD-10-CM | POA: Diagnosis not present

## 2017-04-05 DIAGNOSIS — E1140 Type 2 diabetes mellitus with diabetic neuropathy, unspecified: Secondary | ICD-10-CM | POA: Diagnosis not present

## 2017-04-11 DIAGNOSIS — Z1389 Encounter for screening for other disorder: Secondary | ICD-10-CM | POA: Diagnosis not present

## 2017-04-11 DIAGNOSIS — E1140 Type 2 diabetes mellitus with diabetic neuropathy, unspecified: Secondary | ICD-10-CM | POA: Diagnosis not present

## 2017-04-11 DIAGNOSIS — Z6831 Body mass index (BMI) 31.0-31.9, adult: Secondary | ICD-10-CM | POA: Diagnosis not present

## 2017-04-11 DIAGNOSIS — E782 Mixed hyperlipidemia: Secondary | ICD-10-CM | POA: Diagnosis not present

## 2017-04-16 DIAGNOSIS — E1142 Type 2 diabetes mellitus with diabetic polyneuropathy: Secondary | ICD-10-CM | POA: Diagnosis not present

## 2017-04-16 DIAGNOSIS — L97512 Non-pressure chronic ulcer of other part of right foot with fat layer exposed: Secondary | ICD-10-CM | POA: Diagnosis not present

## 2017-04-30 DIAGNOSIS — L97512 Non-pressure chronic ulcer of other part of right foot with fat layer exposed: Secondary | ICD-10-CM | POA: Diagnosis not present

## 2017-04-30 DIAGNOSIS — E1142 Type 2 diabetes mellitus with diabetic polyneuropathy: Secondary | ICD-10-CM | POA: Diagnosis not present

## 2017-05-07 DIAGNOSIS — L97519 Non-pressure chronic ulcer of other part of right foot with unspecified severity: Secondary | ICD-10-CM | POA: Diagnosis not present

## 2017-05-07 DIAGNOSIS — E1142 Type 2 diabetes mellitus with diabetic polyneuropathy: Secondary | ICD-10-CM | POA: Diagnosis not present

## 2017-05-10 DIAGNOSIS — E6609 Other obesity due to excess calories: Secondary | ICD-10-CM | POA: Diagnosis not present

## 2017-05-10 DIAGNOSIS — Z01411 Encounter for gynecological examination (general) (routine) with abnormal findings: Secondary | ICD-10-CM | POA: Diagnosis not present

## 2017-05-10 DIAGNOSIS — Z1389 Encounter for screening for other disorder: Secondary | ICD-10-CM | POA: Diagnosis not present

## 2017-05-10 DIAGNOSIS — Z1231 Encounter for screening mammogram for malignant neoplasm of breast: Secondary | ICD-10-CM | POA: Diagnosis not present

## 2017-05-10 DIAGNOSIS — Z6831 Body mass index (BMI) 31.0-31.9, adult: Secondary | ICD-10-CM | POA: Diagnosis not present

## 2017-05-10 DIAGNOSIS — Z719 Counseling, unspecified: Secondary | ICD-10-CM | POA: Diagnosis not present

## 2017-05-10 DIAGNOSIS — I10 Essential (primary) hypertension: Secondary | ICD-10-CM | POA: Diagnosis not present

## 2017-05-10 DIAGNOSIS — E1140 Type 2 diabetes mellitus with diabetic neuropathy, unspecified: Secondary | ICD-10-CM | POA: Diagnosis not present

## 2017-05-14 DIAGNOSIS — L97519 Non-pressure chronic ulcer of other part of right foot with unspecified severity: Secondary | ICD-10-CM | POA: Diagnosis not present

## 2017-05-14 DIAGNOSIS — S90821A Blister (nonthermal), right foot, initial encounter: Secondary | ICD-10-CM | POA: Diagnosis not present

## 2017-05-14 DIAGNOSIS — E1142 Type 2 diabetes mellitus with diabetic polyneuropathy: Secondary | ICD-10-CM | POA: Diagnosis not present

## 2017-05-28 DIAGNOSIS — S90821A Blister (nonthermal), right foot, initial encounter: Secondary | ICD-10-CM | POA: Diagnosis not present

## 2017-05-28 DIAGNOSIS — L03031 Cellulitis of right toe: Secondary | ICD-10-CM | POA: Diagnosis not present

## 2017-05-28 DIAGNOSIS — L97512 Non-pressure chronic ulcer of other part of right foot with fat layer exposed: Secondary | ICD-10-CM | POA: Diagnosis not present

## 2017-05-28 DIAGNOSIS — E1142 Type 2 diabetes mellitus with diabetic polyneuropathy: Secondary | ICD-10-CM | POA: Diagnosis not present

## 2017-05-31 DIAGNOSIS — L97512 Non-pressure chronic ulcer of other part of right foot with fat layer exposed: Secondary | ICD-10-CM | POA: Diagnosis not present

## 2017-05-31 DIAGNOSIS — E1142 Type 2 diabetes mellitus with diabetic polyneuropathy: Secondary | ICD-10-CM | POA: Diagnosis not present

## 2017-06-04 DIAGNOSIS — L97519 Non-pressure chronic ulcer of other part of right foot with unspecified severity: Secondary | ICD-10-CM | POA: Diagnosis not present

## 2017-06-04 DIAGNOSIS — L03031 Cellulitis of right toe: Secondary | ICD-10-CM | POA: Diagnosis not present

## 2017-06-04 DIAGNOSIS — E1142 Type 2 diabetes mellitus with diabetic polyneuropathy: Secondary | ICD-10-CM | POA: Diagnosis not present

## 2017-06-11 DIAGNOSIS — L97512 Non-pressure chronic ulcer of other part of right foot with fat layer exposed: Secondary | ICD-10-CM | POA: Diagnosis not present

## 2017-06-11 DIAGNOSIS — E1142 Type 2 diabetes mellitus with diabetic polyneuropathy: Secondary | ICD-10-CM | POA: Diagnosis not present

## 2017-06-25 DIAGNOSIS — L97512 Non-pressure chronic ulcer of other part of right foot with fat layer exposed: Secondary | ICD-10-CM | POA: Diagnosis not present

## 2017-06-25 DIAGNOSIS — E1142 Type 2 diabetes mellitus with diabetic polyneuropathy: Secondary | ICD-10-CM | POA: Diagnosis not present

## 2017-06-25 DIAGNOSIS — L97412 Non-pressure chronic ulcer of right heel and midfoot with fat layer exposed: Secondary | ICD-10-CM | POA: Diagnosis not present

## 2017-07-02 DIAGNOSIS — L97512 Non-pressure chronic ulcer of other part of right foot with fat layer exposed: Secondary | ICD-10-CM | POA: Diagnosis not present

## 2017-07-02 DIAGNOSIS — E1142 Type 2 diabetes mellitus with diabetic polyneuropathy: Secondary | ICD-10-CM | POA: Diagnosis not present

## 2017-07-02 DIAGNOSIS — L97412 Non-pressure chronic ulcer of right heel and midfoot with fat layer exposed: Secondary | ICD-10-CM | POA: Diagnosis not present

## 2017-07-04 DIAGNOSIS — E6609 Other obesity due to excess calories: Secondary | ICD-10-CM | POA: Diagnosis not present

## 2017-07-04 DIAGNOSIS — I10 Essential (primary) hypertension: Secondary | ICD-10-CM | POA: Diagnosis not present

## 2017-07-04 DIAGNOSIS — E1140 Type 2 diabetes mellitus with diabetic neuropathy, unspecified: Secondary | ICD-10-CM | POA: Diagnosis not present

## 2017-07-04 DIAGNOSIS — Z6831 Body mass index (BMI) 31.0-31.9, adult: Secondary | ICD-10-CM | POA: Diagnosis not present

## 2017-07-04 DIAGNOSIS — Z1389 Encounter for screening for other disorder: Secondary | ICD-10-CM | POA: Diagnosis not present

## 2017-07-09 DIAGNOSIS — L97511 Non-pressure chronic ulcer of other part of right foot limited to breakdown of skin: Secondary | ICD-10-CM | POA: Diagnosis not present

## 2017-07-09 DIAGNOSIS — E1142 Type 2 diabetes mellitus with diabetic polyneuropathy: Secondary | ICD-10-CM | POA: Diagnosis not present

## 2017-07-16 DIAGNOSIS — B351 Tinea unguium: Secondary | ICD-10-CM | POA: Diagnosis not present

## 2017-07-16 DIAGNOSIS — E1142 Type 2 diabetes mellitus with diabetic polyneuropathy: Secondary | ICD-10-CM | POA: Diagnosis not present

## 2017-07-16 DIAGNOSIS — L851 Acquired keratosis [keratoderma] palmaris et plantaris: Secondary | ICD-10-CM | POA: Diagnosis not present

## 2017-07-30 DIAGNOSIS — L97512 Non-pressure chronic ulcer of other part of right foot with fat layer exposed: Secondary | ICD-10-CM | POA: Diagnosis not present

## 2017-07-30 DIAGNOSIS — E1142 Type 2 diabetes mellitus with diabetic polyneuropathy: Secondary | ICD-10-CM | POA: Diagnosis not present

## 2017-08-13 DIAGNOSIS — L97511 Non-pressure chronic ulcer of other part of right foot limited to breakdown of skin: Secondary | ICD-10-CM | POA: Diagnosis not present

## 2017-08-13 DIAGNOSIS — E1142 Type 2 diabetes mellitus with diabetic polyneuropathy: Secondary | ICD-10-CM | POA: Diagnosis not present

## 2017-09-16 DIAGNOSIS — Z23 Encounter for immunization: Secondary | ICD-10-CM | POA: Diagnosis not present

## 2017-09-16 DIAGNOSIS — E782 Mixed hyperlipidemia: Secondary | ICD-10-CM | POA: Diagnosis not present

## 2017-09-16 DIAGNOSIS — Z6831 Body mass index (BMI) 31.0-31.9, adult: Secondary | ICD-10-CM | POA: Diagnosis not present

## 2017-09-16 DIAGNOSIS — Z1389 Encounter for screening for other disorder: Secondary | ICD-10-CM | POA: Diagnosis not present

## 2017-09-16 DIAGNOSIS — E1165 Type 2 diabetes mellitus with hyperglycemia: Secondary | ICD-10-CM | POA: Diagnosis not present

## 2017-09-16 DIAGNOSIS — G894 Chronic pain syndrome: Secondary | ICD-10-CM | POA: Diagnosis not present

## 2017-09-24 DIAGNOSIS — E1142 Type 2 diabetes mellitus with diabetic polyneuropathy: Secondary | ICD-10-CM | POA: Diagnosis not present

## 2017-09-24 DIAGNOSIS — L97512 Non-pressure chronic ulcer of other part of right foot with fat layer exposed: Secondary | ICD-10-CM | POA: Diagnosis not present

## 2017-10-01 DIAGNOSIS — L97512 Non-pressure chronic ulcer of other part of right foot with fat layer exposed: Secondary | ICD-10-CM | POA: Diagnosis not present

## 2017-10-01 DIAGNOSIS — E1142 Type 2 diabetes mellitus with diabetic polyneuropathy: Secondary | ICD-10-CM | POA: Diagnosis not present

## 2017-10-11 DIAGNOSIS — L97512 Non-pressure chronic ulcer of other part of right foot with fat layer exposed: Secondary | ICD-10-CM | POA: Diagnosis not present

## 2017-10-11 DIAGNOSIS — E1142 Type 2 diabetes mellitus with diabetic polyneuropathy: Secondary | ICD-10-CM | POA: Diagnosis not present

## 2017-10-29 DIAGNOSIS — L97512 Non-pressure chronic ulcer of other part of right foot with fat layer exposed: Secondary | ICD-10-CM | POA: Diagnosis not present

## 2017-10-29 DIAGNOSIS — E1142 Type 2 diabetes mellitus with diabetic polyneuropathy: Secondary | ICD-10-CM | POA: Diagnosis not present

## 2017-11-12 DIAGNOSIS — L97512 Non-pressure chronic ulcer of other part of right foot with fat layer exposed: Secondary | ICD-10-CM | POA: Diagnosis not present

## 2017-11-12 DIAGNOSIS — E1142 Type 2 diabetes mellitus with diabetic polyneuropathy: Secondary | ICD-10-CM | POA: Diagnosis not present

## 2017-11-14 DIAGNOSIS — Z1389 Encounter for screening for other disorder: Secondary | ICD-10-CM | POA: Diagnosis not present

## 2017-11-14 DIAGNOSIS — E1165 Type 2 diabetes mellitus with hyperglycemia: Secondary | ICD-10-CM | POA: Diagnosis not present

## 2017-11-14 DIAGNOSIS — Z6831 Body mass index (BMI) 31.0-31.9, adult: Secondary | ICD-10-CM | POA: Diagnosis not present

## 2017-11-14 DIAGNOSIS — E11621 Type 2 diabetes mellitus with foot ulcer: Secondary | ICD-10-CM | POA: Diagnosis not present

## 2017-11-14 DIAGNOSIS — E1142 Type 2 diabetes mellitus with diabetic polyneuropathy: Secondary | ICD-10-CM | POA: Diagnosis not present

## 2017-11-14 DIAGNOSIS — E782 Mixed hyperlipidemia: Secondary | ICD-10-CM | POA: Diagnosis not present

## 2017-11-14 DIAGNOSIS — K589 Irritable bowel syndrome without diarrhea: Secondary | ICD-10-CM | POA: Diagnosis not present

## 2017-11-14 DIAGNOSIS — E6609 Other obesity due to excess calories: Secondary | ICD-10-CM | POA: Diagnosis not present

## 2017-11-29 DIAGNOSIS — L97512 Non-pressure chronic ulcer of other part of right foot with fat layer exposed: Secondary | ICD-10-CM | POA: Diagnosis not present

## 2017-11-29 DIAGNOSIS — E1142 Type 2 diabetes mellitus with diabetic polyneuropathy: Secondary | ICD-10-CM | POA: Diagnosis not present

## 2017-12-17 DIAGNOSIS — E1142 Type 2 diabetes mellitus with diabetic polyneuropathy: Secondary | ICD-10-CM | POA: Diagnosis not present

## 2017-12-17 DIAGNOSIS — L97512 Non-pressure chronic ulcer of other part of right foot with fat layer exposed: Secondary | ICD-10-CM | POA: Diagnosis not present

## 2017-12-20 DIAGNOSIS — E1142 Type 2 diabetes mellitus with diabetic polyneuropathy: Secondary | ICD-10-CM | POA: Diagnosis not present

## 2017-12-20 DIAGNOSIS — L97512 Non-pressure chronic ulcer of other part of right foot with fat layer exposed: Secondary | ICD-10-CM | POA: Diagnosis not present

## 2017-12-31 DIAGNOSIS — L97512 Non-pressure chronic ulcer of other part of right foot with fat layer exposed: Secondary | ICD-10-CM | POA: Diagnosis not present

## 2017-12-31 DIAGNOSIS — E1142 Type 2 diabetes mellitus with diabetic polyneuropathy: Secondary | ICD-10-CM | POA: Diagnosis not present

## 2018-01-17 DIAGNOSIS — L97519 Non-pressure chronic ulcer of other part of right foot with unspecified severity: Secondary | ICD-10-CM | POA: Diagnosis not present

## 2018-01-17 DIAGNOSIS — E1142 Type 2 diabetes mellitus with diabetic polyneuropathy: Secondary | ICD-10-CM | POA: Diagnosis not present

## 2018-01-23 DIAGNOSIS — E782 Mixed hyperlipidemia: Secondary | ICD-10-CM | POA: Diagnosis not present

## 2018-01-23 DIAGNOSIS — F419 Anxiety disorder, unspecified: Secondary | ICD-10-CM | POA: Diagnosis not present

## 2018-01-23 DIAGNOSIS — K589 Irritable bowel syndrome without diarrhea: Secondary | ICD-10-CM | POA: Diagnosis not present

## 2018-01-23 DIAGNOSIS — I10 Essential (primary) hypertension: Secondary | ICD-10-CM | POA: Diagnosis not present

## 2018-01-23 DIAGNOSIS — E1142 Type 2 diabetes mellitus with diabetic polyneuropathy: Secondary | ICD-10-CM | POA: Diagnosis not present

## 2018-01-23 DIAGNOSIS — E6609 Other obesity due to excess calories: Secondary | ICD-10-CM | POA: Diagnosis not present

## 2018-01-23 DIAGNOSIS — Z6830 Body mass index (BMI) 30.0-30.9, adult: Secondary | ICD-10-CM | POA: Diagnosis not present

## 2018-01-23 DIAGNOSIS — E11621 Type 2 diabetes mellitus with foot ulcer: Secondary | ICD-10-CM | POA: Diagnosis not present

## 2018-01-23 DIAGNOSIS — Z1389 Encounter for screening for other disorder: Secondary | ICD-10-CM | POA: Diagnosis not present

## 2018-01-24 DIAGNOSIS — L97511 Non-pressure chronic ulcer of other part of right foot limited to breakdown of skin: Secondary | ICD-10-CM | POA: Diagnosis not present

## 2018-02-07 DIAGNOSIS — E1142 Type 2 diabetes mellitus with diabetic polyneuropathy: Secondary | ICD-10-CM | POA: Diagnosis not present

## 2018-02-07 DIAGNOSIS — L97512 Non-pressure chronic ulcer of other part of right foot with fat layer exposed: Secondary | ICD-10-CM | POA: Diagnosis not present

## 2018-02-21 DIAGNOSIS — E1142 Type 2 diabetes mellitus with diabetic polyneuropathy: Secondary | ICD-10-CM | POA: Diagnosis not present

## 2018-02-21 DIAGNOSIS — L97512 Non-pressure chronic ulcer of other part of right foot with fat layer exposed: Secondary | ICD-10-CM | POA: Diagnosis not present

## 2018-03-14 DIAGNOSIS — E1142 Type 2 diabetes mellitus with diabetic polyneuropathy: Secondary | ICD-10-CM | POA: Diagnosis not present

## 2018-03-14 DIAGNOSIS — L97512 Non-pressure chronic ulcer of other part of right foot with fat layer exposed: Secondary | ICD-10-CM | POA: Diagnosis not present

## 2018-03-28 DIAGNOSIS — L97512 Non-pressure chronic ulcer of other part of right foot with fat layer exposed: Secondary | ICD-10-CM | POA: Diagnosis not present

## 2018-03-28 DIAGNOSIS — E1142 Type 2 diabetes mellitus with diabetic polyneuropathy: Secondary | ICD-10-CM | POA: Diagnosis not present

## 2018-03-28 DIAGNOSIS — M14672 Charcot's joint, left ankle and foot: Secondary | ICD-10-CM | POA: Diagnosis not present

## 2018-04-10 DIAGNOSIS — Z1389 Encounter for screening for other disorder: Secondary | ICD-10-CM | POA: Diagnosis not present

## 2018-04-10 DIAGNOSIS — E11621 Type 2 diabetes mellitus with foot ulcer: Secondary | ICD-10-CM | POA: Diagnosis not present

## 2018-04-10 DIAGNOSIS — G894 Chronic pain syndrome: Secondary | ICD-10-CM | POA: Diagnosis not present

## 2018-04-10 DIAGNOSIS — Z6830 Body mass index (BMI) 30.0-30.9, adult: Secondary | ICD-10-CM | POA: Diagnosis not present

## 2018-04-10 DIAGNOSIS — E1142 Type 2 diabetes mellitus with diabetic polyneuropathy: Secondary | ICD-10-CM | POA: Diagnosis not present

## 2018-04-10 DIAGNOSIS — E6609 Other obesity due to excess calories: Secondary | ICD-10-CM | POA: Diagnosis not present

## 2018-04-15 DIAGNOSIS — M14672 Charcot's joint, left ankle and foot: Secondary | ICD-10-CM | POA: Diagnosis not present

## 2018-04-15 DIAGNOSIS — E1142 Type 2 diabetes mellitus with diabetic polyneuropathy: Secondary | ICD-10-CM | POA: Diagnosis not present

## 2018-04-15 DIAGNOSIS — L97512 Non-pressure chronic ulcer of other part of right foot with fat layer exposed: Secondary | ICD-10-CM | POA: Diagnosis not present

## 2018-05-02 DIAGNOSIS — E1142 Type 2 diabetes mellitus with diabetic polyneuropathy: Secondary | ICD-10-CM | POA: Diagnosis not present

## 2018-05-02 DIAGNOSIS — L97512 Non-pressure chronic ulcer of other part of right foot with fat layer exposed: Secondary | ICD-10-CM | POA: Diagnosis not present

## 2018-05-02 DIAGNOSIS — M14672 Charcot's joint, left ankle and foot: Secondary | ICD-10-CM | POA: Diagnosis not present

## 2018-05-16 DIAGNOSIS — E1142 Type 2 diabetes mellitus with diabetic polyneuropathy: Secondary | ICD-10-CM | POA: Diagnosis not present

## 2018-05-16 DIAGNOSIS — L97512 Non-pressure chronic ulcer of other part of right foot with fat layer exposed: Secondary | ICD-10-CM | POA: Diagnosis not present

## 2018-06-03 DIAGNOSIS — E1142 Type 2 diabetes mellitus with diabetic polyneuropathy: Secondary | ICD-10-CM | POA: Diagnosis not present

## 2018-06-03 DIAGNOSIS — L97512 Non-pressure chronic ulcer of other part of right foot with fat layer exposed: Secondary | ICD-10-CM | POA: Diagnosis not present

## 2018-06-24 DIAGNOSIS — E1142 Type 2 diabetes mellitus with diabetic polyneuropathy: Secondary | ICD-10-CM | POA: Diagnosis not present

## 2018-06-24 DIAGNOSIS — L97512 Non-pressure chronic ulcer of other part of right foot with fat layer exposed: Secondary | ICD-10-CM | POA: Diagnosis not present

## 2018-06-26 DIAGNOSIS — Z6830 Body mass index (BMI) 30.0-30.9, adult: Secondary | ICD-10-CM | POA: Diagnosis not present

## 2018-06-26 DIAGNOSIS — E1140 Type 2 diabetes mellitus with diabetic neuropathy, unspecified: Secondary | ICD-10-CM | POA: Diagnosis not present

## 2018-06-26 DIAGNOSIS — E6609 Other obesity due to excess calories: Secondary | ICD-10-CM | POA: Diagnosis not present

## 2018-06-26 DIAGNOSIS — F1120 Opioid dependence, uncomplicated: Secondary | ICD-10-CM | POA: Diagnosis not present

## 2018-06-26 DIAGNOSIS — Z1389 Encounter for screening for other disorder: Secondary | ICD-10-CM | POA: Diagnosis not present

## 2018-07-08 DIAGNOSIS — L97511 Non-pressure chronic ulcer of other part of right foot limited to breakdown of skin: Secondary | ICD-10-CM | POA: Diagnosis not present

## 2018-07-08 DIAGNOSIS — E1142 Type 2 diabetes mellitus with diabetic polyneuropathy: Secondary | ICD-10-CM | POA: Diagnosis not present

## 2018-07-22 DIAGNOSIS — L97511 Non-pressure chronic ulcer of other part of right foot limited to breakdown of skin: Secondary | ICD-10-CM | POA: Diagnosis not present

## 2018-07-22 DIAGNOSIS — E1142 Type 2 diabetes mellitus with diabetic polyneuropathy: Secondary | ICD-10-CM | POA: Diagnosis not present

## 2018-07-31 DIAGNOSIS — G600 Hereditary motor and sensory neuropathy: Secondary | ICD-10-CM | POA: Diagnosis not present

## 2018-07-31 DIAGNOSIS — E7849 Other hyperlipidemia: Secondary | ICD-10-CM | POA: Diagnosis not present

## 2018-07-31 DIAGNOSIS — E782 Mixed hyperlipidemia: Secondary | ICD-10-CM | POA: Diagnosis not present

## 2018-07-31 DIAGNOSIS — E11621 Type 2 diabetes mellitus with foot ulcer: Secondary | ICD-10-CM | POA: Diagnosis not present

## 2018-07-31 DIAGNOSIS — F329 Major depressive disorder, single episode, unspecified: Secondary | ICD-10-CM | POA: Diagnosis not present

## 2018-07-31 DIAGNOSIS — F1120 Opioid dependence, uncomplicated: Secondary | ICD-10-CM | POA: Diagnosis not present

## 2018-07-31 DIAGNOSIS — G894 Chronic pain syndrome: Secondary | ICD-10-CM | POA: Diagnosis not present

## 2018-07-31 DIAGNOSIS — F419 Anxiety disorder, unspecified: Secondary | ICD-10-CM | POA: Diagnosis not present

## 2018-07-31 DIAGNOSIS — Z6829 Body mass index (BMI) 29.0-29.9, adult: Secondary | ICD-10-CM | POA: Diagnosis not present

## 2018-07-31 DIAGNOSIS — I10 Essential (primary) hypertension: Secondary | ICD-10-CM | POA: Diagnosis not present

## 2018-07-31 DIAGNOSIS — E1140 Type 2 diabetes mellitus with diabetic neuropathy, unspecified: Secondary | ICD-10-CM | POA: Diagnosis not present

## 2018-08-19 DIAGNOSIS — L97512 Non-pressure chronic ulcer of other part of right foot with fat layer exposed: Secondary | ICD-10-CM | POA: Diagnosis not present

## 2018-08-19 DIAGNOSIS — E1142 Type 2 diabetes mellitus with diabetic polyneuropathy: Secondary | ICD-10-CM | POA: Diagnosis not present

## 2018-09-05 DIAGNOSIS — Z6829 Body mass index (BMI) 29.0-29.9, adult: Secondary | ICD-10-CM | POA: Diagnosis not present

## 2018-09-05 DIAGNOSIS — E1142 Type 2 diabetes mellitus with diabetic polyneuropathy: Secondary | ICD-10-CM | POA: Diagnosis not present

## 2018-09-05 DIAGNOSIS — E663 Overweight: Secondary | ICD-10-CM | POA: Diagnosis not present

## 2018-09-05 DIAGNOSIS — Z1389 Encounter for screening for other disorder: Secondary | ICD-10-CM | POA: Diagnosis not present

## 2018-09-09 DIAGNOSIS — L97512 Non-pressure chronic ulcer of other part of right foot with fat layer exposed: Secondary | ICD-10-CM | POA: Diagnosis not present

## 2018-09-09 DIAGNOSIS — E1142 Type 2 diabetes mellitus with diabetic polyneuropathy: Secondary | ICD-10-CM | POA: Diagnosis not present

## 2018-09-23 DIAGNOSIS — L97512 Non-pressure chronic ulcer of other part of right foot with fat layer exposed: Secondary | ICD-10-CM | POA: Diagnosis not present

## 2018-09-23 DIAGNOSIS — E1142 Type 2 diabetes mellitus with diabetic polyneuropathy: Secondary | ICD-10-CM | POA: Diagnosis not present

## 2018-10-02 DIAGNOSIS — E663 Overweight: Secondary | ICD-10-CM | POA: Diagnosis not present

## 2018-10-02 DIAGNOSIS — Z23 Encounter for immunization: Secondary | ICD-10-CM | POA: Diagnosis not present

## 2018-10-02 DIAGNOSIS — E1140 Type 2 diabetes mellitus with diabetic neuropathy, unspecified: Secondary | ICD-10-CM | POA: Diagnosis not present

## 2018-10-02 DIAGNOSIS — Z1389 Encounter for screening for other disorder: Secondary | ICD-10-CM | POA: Diagnosis not present

## 2018-10-02 DIAGNOSIS — F1120 Opioid dependence, uncomplicated: Secondary | ICD-10-CM | POA: Diagnosis not present

## 2018-10-02 DIAGNOSIS — Z6829 Body mass index (BMI) 29.0-29.9, adult: Secondary | ICD-10-CM | POA: Diagnosis not present

## 2018-10-10 DIAGNOSIS — E1142 Type 2 diabetes mellitus with diabetic polyneuropathy: Secondary | ICD-10-CM | POA: Diagnosis not present

## 2018-10-10 DIAGNOSIS — L97519 Non-pressure chronic ulcer of other part of right foot with unspecified severity: Secondary | ICD-10-CM | POA: Diagnosis not present

## 2018-10-30 DIAGNOSIS — E1142 Type 2 diabetes mellitus with diabetic polyneuropathy: Secondary | ICD-10-CM | POA: Diagnosis not present

## 2018-10-30 DIAGNOSIS — Z6830 Body mass index (BMI) 30.0-30.9, adult: Secondary | ICD-10-CM | POA: Diagnosis not present

## 2018-10-30 DIAGNOSIS — Z1389 Encounter for screening for other disorder: Secondary | ICD-10-CM | POA: Diagnosis not present

## 2018-10-30 DIAGNOSIS — E6609 Other obesity due to excess calories: Secondary | ICD-10-CM | POA: Diagnosis not present

## 2018-10-31 DIAGNOSIS — E1142 Type 2 diabetes mellitus with diabetic polyneuropathy: Secondary | ICD-10-CM | POA: Diagnosis not present

## 2018-10-31 DIAGNOSIS — L97519 Non-pressure chronic ulcer of other part of right foot with unspecified severity: Secondary | ICD-10-CM | POA: Diagnosis not present

## 2018-12-02 DIAGNOSIS — L97519 Non-pressure chronic ulcer of other part of right foot with unspecified severity: Secondary | ICD-10-CM | POA: Diagnosis not present

## 2018-12-02 DIAGNOSIS — E1142 Type 2 diabetes mellitus with diabetic polyneuropathy: Secondary | ICD-10-CM | POA: Diagnosis not present

## 2018-12-23 DIAGNOSIS — L97519 Non-pressure chronic ulcer of other part of right foot with unspecified severity: Secondary | ICD-10-CM | POA: Diagnosis not present

## 2018-12-23 DIAGNOSIS — E1142 Type 2 diabetes mellitus with diabetic polyneuropathy: Secondary | ICD-10-CM | POA: Diagnosis not present

## 2019-01-02 DIAGNOSIS — F4320 Adjustment disorder, unspecified: Secondary | ICD-10-CM | POA: Diagnosis not present

## 2019-01-02 DIAGNOSIS — E6609 Other obesity due to excess calories: Secondary | ICD-10-CM | POA: Diagnosis not present

## 2019-01-02 DIAGNOSIS — F329 Major depressive disorder, single episode, unspecified: Secondary | ICD-10-CM | POA: Diagnosis not present

## 2019-01-02 DIAGNOSIS — I10 Essential (primary) hypertension: Secondary | ICD-10-CM | POA: Diagnosis not present

## 2019-01-02 DIAGNOSIS — Z1389 Encounter for screening for other disorder: Secondary | ICD-10-CM | POA: Diagnosis not present

## 2019-01-02 DIAGNOSIS — E1142 Type 2 diabetes mellitus with diabetic polyneuropathy: Secondary | ICD-10-CM | POA: Diagnosis not present

## 2019-01-02 DIAGNOSIS — Z6830 Body mass index (BMI) 30.0-30.9, adult: Secondary | ICD-10-CM | POA: Diagnosis not present

## 2019-01-13 DIAGNOSIS — L97519 Non-pressure chronic ulcer of other part of right foot with unspecified severity: Secondary | ICD-10-CM | POA: Diagnosis not present

## 2019-01-13 DIAGNOSIS — E1142 Type 2 diabetes mellitus with diabetic polyneuropathy: Secondary | ICD-10-CM | POA: Diagnosis not present

## 2019-02-03 DIAGNOSIS — E1142 Type 2 diabetes mellitus with diabetic polyneuropathy: Secondary | ICD-10-CM | POA: Diagnosis not present

## 2019-02-03 DIAGNOSIS — L97519 Non-pressure chronic ulcer of other part of right foot with unspecified severity: Secondary | ICD-10-CM | POA: Diagnosis not present

## 2019-02-05 DIAGNOSIS — Z6831 Body mass index (BMI) 31.0-31.9, adult: Secondary | ICD-10-CM | POA: Diagnosis not present

## 2019-02-05 DIAGNOSIS — Z1389 Encounter for screening for other disorder: Secondary | ICD-10-CM | POA: Diagnosis not present

## 2019-02-05 DIAGNOSIS — E1140 Type 2 diabetes mellitus with diabetic neuropathy, unspecified: Secondary | ICD-10-CM | POA: Diagnosis not present

## 2019-02-05 DIAGNOSIS — E6609 Other obesity due to excess calories: Secondary | ICD-10-CM | POA: Diagnosis not present

## 2019-02-24 DIAGNOSIS — E1142 Type 2 diabetes mellitus with diabetic polyneuropathy: Secondary | ICD-10-CM | POA: Diagnosis not present

## 2019-02-24 DIAGNOSIS — L97519 Non-pressure chronic ulcer of other part of right foot with unspecified severity: Secondary | ICD-10-CM | POA: Diagnosis not present

## 2019-03-09 DIAGNOSIS — G894 Chronic pain syndrome: Secondary | ICD-10-CM | POA: Diagnosis not present

## 2019-03-09 DIAGNOSIS — E1142 Type 2 diabetes mellitus with diabetic polyneuropathy: Secondary | ICD-10-CM | POA: Diagnosis not present

## 2019-03-17 DIAGNOSIS — L97519 Non-pressure chronic ulcer of other part of right foot with unspecified severity: Secondary | ICD-10-CM | POA: Diagnosis not present

## 2019-03-17 DIAGNOSIS — E1142 Type 2 diabetes mellitus with diabetic polyneuropathy: Secondary | ICD-10-CM | POA: Diagnosis not present

## 2019-04-09 DIAGNOSIS — E1142 Type 2 diabetes mellitus with diabetic polyneuropathy: Secondary | ICD-10-CM | POA: Diagnosis not present

## 2019-05-07 DIAGNOSIS — E1142 Type 2 diabetes mellitus with diabetic polyneuropathy: Secondary | ICD-10-CM | POA: Diagnosis not present

## 2019-06-12 DIAGNOSIS — E11621 Type 2 diabetes mellitus with foot ulcer: Secondary | ICD-10-CM | POA: Diagnosis not present

## 2019-06-12 DIAGNOSIS — L97512 Non-pressure chronic ulcer of other part of right foot with fat layer exposed: Secondary | ICD-10-CM | POA: Diagnosis not present

## 2019-06-12 DIAGNOSIS — E7849 Other hyperlipidemia: Secondary | ICD-10-CM | POA: Diagnosis not present

## 2019-06-12 DIAGNOSIS — E1142 Type 2 diabetes mellitus with diabetic polyneuropathy: Secondary | ICD-10-CM | POA: Diagnosis not present

## 2019-06-12 DIAGNOSIS — G894 Chronic pain syndrome: Secondary | ICD-10-CM | POA: Diagnosis not present

## 2019-06-23 DIAGNOSIS — L97512 Non-pressure chronic ulcer of other part of right foot with fat layer exposed: Secondary | ICD-10-CM | POA: Diagnosis not present

## 2019-06-23 DIAGNOSIS — E1142 Type 2 diabetes mellitus with diabetic polyneuropathy: Secondary | ICD-10-CM | POA: Diagnosis not present

## 2019-07-07 DIAGNOSIS — L97512 Non-pressure chronic ulcer of other part of right foot with fat layer exposed: Secondary | ICD-10-CM | POA: Diagnosis not present

## 2019-07-07 DIAGNOSIS — E1142 Type 2 diabetes mellitus with diabetic polyneuropathy: Secondary | ICD-10-CM | POA: Diagnosis not present

## 2019-07-10 DIAGNOSIS — E1142 Type 2 diabetes mellitus with diabetic polyneuropathy: Secondary | ICD-10-CM | POA: Diagnosis not present

## 2019-07-10 DIAGNOSIS — G894 Chronic pain syndrome: Secondary | ICD-10-CM | POA: Diagnosis not present

## 2019-07-28 DIAGNOSIS — E1142 Type 2 diabetes mellitus with diabetic polyneuropathy: Secondary | ICD-10-CM | POA: Diagnosis not present

## 2019-07-28 DIAGNOSIS — S91301A Unspecified open wound, right foot, initial encounter: Secondary | ICD-10-CM | POA: Diagnosis not present

## 2019-07-30 DIAGNOSIS — E6609 Other obesity due to excess calories: Secondary | ICD-10-CM | POA: Diagnosis not present

## 2019-07-30 DIAGNOSIS — E1140 Type 2 diabetes mellitus with diabetic neuropathy, unspecified: Secondary | ICD-10-CM | POA: Diagnosis not present

## 2019-07-30 DIAGNOSIS — Z6831 Body mass index (BMI) 31.0-31.9, adult: Secondary | ICD-10-CM | POA: Diagnosis not present

## 2019-07-30 DIAGNOSIS — I10 Essential (primary) hypertension: Secondary | ICD-10-CM | POA: Diagnosis not present

## 2019-07-30 DIAGNOSIS — E1142 Type 2 diabetes mellitus with diabetic polyneuropathy: Secondary | ICD-10-CM | POA: Diagnosis not present

## 2019-07-30 DIAGNOSIS — Z23 Encounter for immunization: Secondary | ICD-10-CM | POA: Diagnosis not present

## 2019-07-30 DIAGNOSIS — F329 Major depressive disorder, single episode, unspecified: Secondary | ICD-10-CM | POA: Diagnosis not present

## 2019-07-30 DIAGNOSIS — Z0001 Encounter for general adult medical examination with abnormal findings: Secondary | ICD-10-CM | POA: Diagnosis not present

## 2019-07-30 DIAGNOSIS — G894 Chronic pain syndrome: Secondary | ICD-10-CM | POA: Diagnosis not present

## 2019-07-30 DIAGNOSIS — E119 Type 2 diabetes mellitus without complications: Secondary | ICD-10-CM | POA: Diagnosis not present

## 2019-07-30 DIAGNOSIS — E11621 Type 2 diabetes mellitus with foot ulcer: Secondary | ICD-10-CM | POA: Diagnosis not present

## 2019-08-20 DIAGNOSIS — E1142 Type 2 diabetes mellitus with diabetic polyneuropathy: Secondary | ICD-10-CM | POA: Diagnosis not present

## 2019-09-18 DIAGNOSIS — L84 Corns and callosities: Secondary | ICD-10-CM | POA: Diagnosis not present

## 2019-09-18 DIAGNOSIS — E1142 Type 2 diabetes mellitus with diabetic polyneuropathy: Secondary | ICD-10-CM | POA: Diagnosis not present

## 2019-10-09 DIAGNOSIS — L84 Corns and callosities: Secondary | ICD-10-CM | POA: Diagnosis not present

## 2019-10-09 DIAGNOSIS — E1142 Type 2 diabetes mellitus with diabetic polyneuropathy: Secondary | ICD-10-CM | POA: Diagnosis not present

## 2019-11-06 DIAGNOSIS — L84 Corns and callosities: Secondary | ICD-10-CM | POA: Diagnosis not present

## 2019-11-06 DIAGNOSIS — E1142 Type 2 diabetes mellitus with diabetic polyneuropathy: Secondary | ICD-10-CM | POA: Diagnosis not present

## 2019-11-13 DIAGNOSIS — E1142 Type 2 diabetes mellitus with diabetic polyneuropathy: Secondary | ICD-10-CM | POA: Diagnosis not present

## 2019-12-01 DIAGNOSIS — L851 Acquired keratosis [keratoderma] palmaris et plantaris: Secondary | ICD-10-CM | POA: Diagnosis not present

## 2019-12-01 DIAGNOSIS — E1142 Type 2 diabetes mellitus with diabetic polyneuropathy: Secondary | ICD-10-CM | POA: Diagnosis not present

## 2019-12-01 DIAGNOSIS — B351 Tinea unguium: Secondary | ICD-10-CM | POA: Diagnosis not present

## 2019-12-10 DIAGNOSIS — E1142 Type 2 diabetes mellitus with diabetic polyneuropathy: Secondary | ICD-10-CM | POA: Diagnosis not present

## 2019-12-29 DIAGNOSIS — E1142 Type 2 diabetes mellitus with diabetic polyneuropathy: Secondary | ICD-10-CM | POA: Diagnosis not present

## 2019-12-29 DIAGNOSIS — L84 Corns and callosities: Secondary | ICD-10-CM | POA: Diagnosis not present

## 2019-12-31 DIAGNOSIS — Z6829 Body mass index (BMI) 29.0-29.9, adult: Secondary | ICD-10-CM | POA: Diagnosis not present

## 2019-12-31 DIAGNOSIS — E1140 Type 2 diabetes mellitus with diabetic neuropathy, unspecified: Secondary | ICD-10-CM | POA: Diagnosis not present

## 2019-12-31 DIAGNOSIS — E663 Overweight: Secondary | ICD-10-CM | POA: Diagnosis not present

## 2019-12-31 DIAGNOSIS — Z1389 Encounter for screening for other disorder: Secondary | ICD-10-CM | POA: Diagnosis not present

## 2019-12-31 DIAGNOSIS — E7849 Other hyperlipidemia: Secondary | ICD-10-CM | POA: Diagnosis not present

## 2019-12-31 DIAGNOSIS — E1165 Type 2 diabetes mellitus with hyperglycemia: Secondary | ICD-10-CM | POA: Diagnosis not present

## 2019-12-31 DIAGNOSIS — I10 Essential (primary) hypertension: Secondary | ICD-10-CM | POA: Diagnosis not present

## 2019-12-31 DIAGNOSIS — E119 Type 2 diabetes mellitus without complications: Secondary | ICD-10-CM | POA: Diagnosis not present

## 2020-01-13 DIAGNOSIS — T50905A Adverse effect of unspecified drugs, medicaments and biological substances, initial encounter: Secondary | ICD-10-CM | POA: Diagnosis not present

## 2020-01-13 DIAGNOSIS — E1140 Type 2 diabetes mellitus with diabetic neuropathy, unspecified: Secondary | ICD-10-CM | POA: Diagnosis not present

## 2020-01-26 DIAGNOSIS — E1142 Type 2 diabetes mellitus with diabetic polyneuropathy: Secondary | ICD-10-CM | POA: Diagnosis not present

## 2020-02-11 DIAGNOSIS — E1142 Type 2 diabetes mellitus with diabetic polyneuropathy: Secondary | ICD-10-CM | POA: Diagnosis not present

## 2020-02-11 DIAGNOSIS — E7849 Other hyperlipidemia: Secondary | ICD-10-CM | POA: Diagnosis not present

## 2020-03-04 DIAGNOSIS — E1142 Type 2 diabetes mellitus with diabetic polyneuropathy: Secondary | ICD-10-CM | POA: Diagnosis not present

## 2020-03-04 DIAGNOSIS — L84 Corns and callosities: Secondary | ICD-10-CM | POA: Diagnosis not present

## 2020-03-17 DIAGNOSIS — E1142 Type 2 diabetes mellitus with diabetic polyneuropathy: Secondary | ICD-10-CM | POA: Diagnosis not present

## 2020-04-01 DIAGNOSIS — E1142 Type 2 diabetes mellitus with diabetic polyneuropathy: Secondary | ICD-10-CM | POA: Diagnosis not present

## 2020-05-13 DIAGNOSIS — L03032 Cellulitis of left toe: Secondary | ICD-10-CM | POA: Diagnosis not present

## 2020-05-13 DIAGNOSIS — E1142 Type 2 diabetes mellitus with diabetic polyneuropathy: Secondary | ICD-10-CM | POA: Diagnosis not present

## 2020-05-13 DIAGNOSIS — L97529 Non-pressure chronic ulcer of other part of left foot with unspecified severity: Secondary | ICD-10-CM | POA: Diagnosis not present

## 2020-05-13 DIAGNOSIS — L97522 Non-pressure chronic ulcer of other part of left foot with fat layer exposed: Secondary | ICD-10-CM | POA: Diagnosis not present

## 2020-05-20 DIAGNOSIS — I10 Essential (primary) hypertension: Secondary | ICD-10-CM | POA: Diagnosis not present

## 2020-05-20 DIAGNOSIS — Z6827 Body mass index (BMI) 27.0-27.9, adult: Secondary | ICD-10-CM | POA: Diagnosis not present

## 2020-05-20 DIAGNOSIS — E1165 Type 2 diabetes mellitus with hyperglycemia: Secondary | ICD-10-CM | POA: Diagnosis not present

## 2020-05-20 DIAGNOSIS — E7849 Other hyperlipidemia: Secondary | ICD-10-CM | POA: Diagnosis not present

## 2020-05-20 DIAGNOSIS — E1140 Type 2 diabetes mellitus with diabetic neuropathy, unspecified: Secondary | ICD-10-CM | POA: Diagnosis not present

## 2020-05-20 DIAGNOSIS — E663 Overweight: Secondary | ICD-10-CM | POA: Diagnosis not present

## 2020-05-27 DIAGNOSIS — L97522 Non-pressure chronic ulcer of other part of left foot with fat layer exposed: Secondary | ICD-10-CM | POA: Diagnosis not present

## 2020-05-27 DIAGNOSIS — L03032 Cellulitis of left toe: Secondary | ICD-10-CM | POA: Diagnosis not present

## 2020-05-27 DIAGNOSIS — E1142 Type 2 diabetes mellitus with diabetic polyneuropathy: Secondary | ICD-10-CM | POA: Diagnosis not present

## 2020-06-10 DIAGNOSIS — E1142 Type 2 diabetes mellitus with diabetic polyneuropathy: Secondary | ICD-10-CM | POA: Diagnosis not present

## 2020-06-10 DIAGNOSIS — L97522 Non-pressure chronic ulcer of other part of left foot with fat layer exposed: Secondary | ICD-10-CM | POA: Diagnosis not present

## 2020-06-16 DIAGNOSIS — E1142 Type 2 diabetes mellitus with diabetic polyneuropathy: Secondary | ICD-10-CM | POA: Diagnosis not present

## 2020-06-16 DIAGNOSIS — I10 Essential (primary) hypertension: Secondary | ICD-10-CM | POA: Diagnosis not present

## 2020-06-16 DIAGNOSIS — G894 Chronic pain syndrome: Secondary | ICD-10-CM | POA: Diagnosis not present

## 2020-06-16 DIAGNOSIS — E7849 Other hyperlipidemia: Secondary | ICD-10-CM | POA: Diagnosis not present

## 2020-06-16 DIAGNOSIS — Z6828 Body mass index (BMI) 28.0-28.9, adult: Secondary | ICD-10-CM | POA: Diagnosis not present

## 2020-07-01 DIAGNOSIS — L03032 Cellulitis of left toe: Secondary | ICD-10-CM | POA: Diagnosis not present

## 2020-07-01 DIAGNOSIS — L97522 Non-pressure chronic ulcer of other part of left foot with fat layer exposed: Secondary | ICD-10-CM | POA: Diagnosis not present

## 2020-07-01 DIAGNOSIS — M869 Osteomyelitis, unspecified: Secondary | ICD-10-CM | POA: Diagnosis not present

## 2020-07-01 DIAGNOSIS — E1142 Type 2 diabetes mellitus with diabetic polyneuropathy: Secondary | ICD-10-CM | POA: Diagnosis not present

## 2020-07-05 ENCOUNTER — Other Ambulatory Visit (HOSPITAL_COMMUNITY): Payer: Self-pay | Admitting: Podiatry

## 2020-07-05 ENCOUNTER — Other Ambulatory Visit: Payer: Self-pay | Admitting: Podiatry

## 2020-07-05 DIAGNOSIS — M869 Osteomyelitis, unspecified: Secondary | ICD-10-CM

## 2020-07-14 ENCOUNTER — Other Ambulatory Visit: Payer: Self-pay

## 2020-07-14 ENCOUNTER — Other Ambulatory Visit (HOSPITAL_COMMUNITY): Payer: Self-pay | Admitting: Podiatry

## 2020-07-14 ENCOUNTER — Ambulatory Visit (HOSPITAL_COMMUNITY)
Admission: RE | Admit: 2020-07-14 | Discharge: 2020-07-14 | Disposition: A | Discharge status: Home / Self Care | Payer: BC Managed Care – PPO | Source: Ambulatory Visit | Attending: Podiatry | Admitting: Podiatry

## 2020-07-14 DIAGNOSIS — M869 Osteomyelitis, unspecified: Secondary | ICD-10-CM | POA: Insufficient documentation

## 2020-07-14 DIAGNOSIS — E11621 Type 2 diabetes mellitus with foot ulcer: Secondary | ICD-10-CM | POA: Diagnosis not present

## 2020-07-14 DIAGNOSIS — G894 Chronic pain syndrome: Secondary | ICD-10-CM | POA: Diagnosis not present

## 2020-07-14 DIAGNOSIS — G600 Hereditary motor and sensory neuropathy: Secondary | ICD-10-CM | POA: Diagnosis not present

## 2020-07-15 DIAGNOSIS — L97522 Non-pressure chronic ulcer of other part of left foot with fat layer exposed: Secondary | ICD-10-CM | POA: Diagnosis not present

## 2020-07-15 DIAGNOSIS — E1142 Type 2 diabetes mellitus with diabetic polyneuropathy: Secondary | ICD-10-CM | POA: Diagnosis not present

## 2020-07-15 DIAGNOSIS — M869 Osteomyelitis, unspecified: Secondary | ICD-10-CM | POA: Diagnosis not present

## 2020-07-19 DIAGNOSIS — M869 Osteomyelitis, unspecified: Secondary | ICD-10-CM | POA: Diagnosis not present

## 2020-07-19 DIAGNOSIS — E1142 Type 2 diabetes mellitus with diabetic polyneuropathy: Secondary | ICD-10-CM | POA: Diagnosis not present

## 2020-07-19 DIAGNOSIS — L97522 Non-pressure chronic ulcer of other part of left foot with fat layer exposed: Secondary | ICD-10-CM | POA: Diagnosis not present

## 2020-07-22 DIAGNOSIS — L97522 Non-pressure chronic ulcer of other part of left foot with fat layer exposed: Secondary | ICD-10-CM | POA: Diagnosis not present

## 2020-07-22 DIAGNOSIS — E1142 Type 2 diabetes mellitus with diabetic polyneuropathy: Secondary | ICD-10-CM | POA: Diagnosis not present

## 2020-08-05 DIAGNOSIS — E1142 Type 2 diabetes mellitus with diabetic polyneuropathy: Secondary | ICD-10-CM | POA: Diagnosis not present

## 2020-08-05 DIAGNOSIS — L97522 Non-pressure chronic ulcer of other part of left foot with fat layer exposed: Secondary | ICD-10-CM | POA: Diagnosis not present

## 2020-08-18 DIAGNOSIS — R110 Nausea: Secondary | ICD-10-CM | POA: Diagnosis not present

## 2020-08-18 DIAGNOSIS — E11621 Type 2 diabetes mellitus with foot ulcer: Secondary | ICD-10-CM | POA: Diagnosis not present

## 2020-08-18 DIAGNOSIS — E7849 Other hyperlipidemia: Secondary | ICD-10-CM | POA: Diagnosis not present

## 2020-08-18 DIAGNOSIS — G894 Chronic pain syndrome: Secondary | ICD-10-CM | POA: Diagnosis not present

## 2020-08-19 DIAGNOSIS — E1142 Type 2 diabetes mellitus with diabetic polyneuropathy: Secondary | ICD-10-CM | POA: Diagnosis not present

## 2020-08-19 DIAGNOSIS — L97522 Non-pressure chronic ulcer of other part of left foot with fat layer exposed: Secondary | ICD-10-CM | POA: Diagnosis not present

## 2020-08-19 DIAGNOSIS — S91301A Unspecified open wound, right foot, initial encounter: Secondary | ICD-10-CM | POA: Diagnosis not present

## 2020-09-02 DIAGNOSIS — E1142 Type 2 diabetes mellitus with diabetic polyneuropathy: Secondary | ICD-10-CM | POA: Diagnosis not present

## 2020-09-02 DIAGNOSIS — M2042 Other hammer toe(s) (acquired), left foot: Secondary | ICD-10-CM | POA: Diagnosis not present

## 2020-09-02 DIAGNOSIS — L97522 Non-pressure chronic ulcer of other part of left foot with fat layer exposed: Secondary | ICD-10-CM | POA: Diagnosis not present

## 2020-09-09 DIAGNOSIS — E1142 Type 2 diabetes mellitus with diabetic polyneuropathy: Secondary | ICD-10-CM | POA: Diagnosis not present

## 2020-09-09 DIAGNOSIS — S91105A Unspecified open wound of left lesser toe(s) without damage to nail, initial encounter: Secondary | ICD-10-CM | POA: Diagnosis not present

## 2020-09-22 DIAGNOSIS — I10 Essential (primary) hypertension: Secondary | ICD-10-CM | POA: Diagnosis not present

## 2020-09-22 DIAGNOSIS — E119 Type 2 diabetes mellitus without complications: Secondary | ICD-10-CM | POA: Diagnosis not present

## 2020-09-22 DIAGNOSIS — E7849 Other hyperlipidemia: Secondary | ICD-10-CM | POA: Diagnosis not present

## 2020-09-22 DIAGNOSIS — Z23 Encounter for immunization: Secondary | ICD-10-CM | POA: Diagnosis not present

## 2020-09-22 DIAGNOSIS — Z681 Body mass index (BMI) 19 or less, adult: Secondary | ICD-10-CM | POA: Diagnosis not present

## 2020-10-11 DIAGNOSIS — G894 Chronic pain syndrome: Secondary | ICD-10-CM | POA: Diagnosis not present

## 2020-11-24 DIAGNOSIS — Z20822 Contact with and (suspected) exposure to covid-19: Secondary | ICD-10-CM | POA: Diagnosis not present

## 2020-11-24 DIAGNOSIS — E1142 Type 2 diabetes mellitus with diabetic polyneuropathy: Secondary | ICD-10-CM | POA: Diagnosis not present

## 2020-12-02 DIAGNOSIS — E1142 Type 2 diabetes mellitus with diabetic polyneuropathy: Secondary | ICD-10-CM | POA: Diagnosis not present

## 2020-12-02 DIAGNOSIS — S91102D Unspecified open wound of left great toe without damage to nail, subsequent encounter: Secondary | ICD-10-CM | POA: Diagnosis not present

## 2020-12-22 DIAGNOSIS — E119 Type 2 diabetes mellitus without complications: Secondary | ICD-10-CM | POA: Diagnosis not present

## 2020-12-22 DIAGNOSIS — Z6827 Body mass index (BMI) 27.0-27.9, adult: Secondary | ICD-10-CM | POA: Diagnosis not present

## 2020-12-22 DIAGNOSIS — G894 Chronic pain syndrome: Secondary | ICD-10-CM | POA: Diagnosis not present

## 2020-12-22 DIAGNOSIS — E1142 Type 2 diabetes mellitus with diabetic polyneuropathy: Secondary | ICD-10-CM | POA: Diagnosis not present

## 2020-12-22 DIAGNOSIS — I10 Essential (primary) hypertension: Secondary | ICD-10-CM | POA: Diagnosis not present

## 2020-12-22 DIAGNOSIS — E663 Overweight: Secondary | ICD-10-CM | POA: Diagnosis not present

## 2020-12-22 DIAGNOSIS — E7849 Other hyperlipidemia: Secondary | ICD-10-CM | POA: Diagnosis not present

## 2020-12-22 DIAGNOSIS — F17290 Nicotine dependence, other tobacco product, uncomplicated: Secondary | ICD-10-CM | POA: Diagnosis not present

## 2020-12-23 DIAGNOSIS — R234 Changes in skin texture: Secondary | ICD-10-CM | POA: Diagnosis not present

## 2020-12-23 DIAGNOSIS — B351 Tinea unguium: Secondary | ICD-10-CM | POA: Diagnosis not present

## 2020-12-23 DIAGNOSIS — E1142 Type 2 diabetes mellitus with diabetic polyneuropathy: Secondary | ICD-10-CM | POA: Diagnosis not present

## 2020-12-23 DIAGNOSIS — L851 Acquired keratosis [keratoderma] palmaris et plantaris: Secondary | ICD-10-CM | POA: Diagnosis not present

## 2021-01-03 DIAGNOSIS — L97512 Non-pressure chronic ulcer of other part of right foot with fat layer exposed: Secondary | ICD-10-CM | POA: Diagnosis not present

## 2021-01-03 DIAGNOSIS — E1142 Type 2 diabetes mellitus with diabetic polyneuropathy: Secondary | ICD-10-CM | POA: Diagnosis not present

## 2021-01-03 DIAGNOSIS — L03031 Cellulitis of right toe: Secondary | ICD-10-CM | POA: Diagnosis not present

## 2021-01-06 DIAGNOSIS — E1142 Type 2 diabetes mellitus with diabetic polyneuropathy: Secondary | ICD-10-CM | POA: Diagnosis not present

## 2021-01-06 DIAGNOSIS — L97522 Non-pressure chronic ulcer of other part of left foot with fat layer exposed: Secondary | ICD-10-CM | POA: Diagnosis not present

## 2021-01-06 DIAGNOSIS — L03032 Cellulitis of left toe: Secondary | ICD-10-CM | POA: Diagnosis not present

## 2021-01-13 DIAGNOSIS — E1142 Type 2 diabetes mellitus with diabetic polyneuropathy: Secondary | ICD-10-CM | POA: Diagnosis not present

## 2021-01-13 DIAGNOSIS — L97522 Non-pressure chronic ulcer of other part of left foot with fat layer exposed: Secondary | ICD-10-CM | POA: Diagnosis not present

## 2021-01-20 DIAGNOSIS — E11621 Type 2 diabetes mellitus with foot ulcer: Secondary | ICD-10-CM | POA: Diagnosis not present

## 2021-01-20 DIAGNOSIS — E7849 Other hyperlipidemia: Secondary | ICD-10-CM | POA: Diagnosis not present

## 2021-01-20 DIAGNOSIS — E1142 Type 2 diabetes mellitus with diabetic polyneuropathy: Secondary | ICD-10-CM | POA: Diagnosis not present

## 2021-01-20 DIAGNOSIS — I10 Essential (primary) hypertension: Secondary | ICD-10-CM | POA: Diagnosis not present

## 2021-01-27 DIAGNOSIS — E1142 Type 2 diabetes mellitus with diabetic polyneuropathy: Secondary | ICD-10-CM | POA: Diagnosis not present

## 2021-01-27 DIAGNOSIS — L97522 Non-pressure chronic ulcer of other part of left foot with fat layer exposed: Secondary | ICD-10-CM | POA: Diagnosis not present

## 2021-02-10 DIAGNOSIS — E1142 Type 2 diabetes mellitus with diabetic polyneuropathy: Secondary | ICD-10-CM | POA: Diagnosis not present

## 2021-02-10 DIAGNOSIS — L97522 Non-pressure chronic ulcer of other part of left foot with fat layer exposed: Secondary | ICD-10-CM | POA: Diagnosis not present

## 2021-02-16 DIAGNOSIS — Z681 Body mass index (BMI) 19 or less, adult: Secondary | ICD-10-CM | POA: Diagnosis not present

## 2021-02-16 DIAGNOSIS — E1142 Type 2 diabetes mellitus with diabetic polyneuropathy: Secondary | ICD-10-CM | POA: Diagnosis not present

## 2021-02-24 DIAGNOSIS — B351 Tinea unguium: Secondary | ICD-10-CM | POA: Diagnosis not present

## 2021-02-24 DIAGNOSIS — E1142 Type 2 diabetes mellitus with diabetic polyneuropathy: Secondary | ICD-10-CM | POA: Diagnosis not present

## 2021-02-24 DIAGNOSIS — L97522 Non-pressure chronic ulcer of other part of left foot with fat layer exposed: Secondary | ICD-10-CM | POA: Diagnosis not present

## 2021-03-17 DIAGNOSIS — L97522 Non-pressure chronic ulcer of other part of left foot with fat layer exposed: Secondary | ICD-10-CM | POA: Diagnosis not present

## 2021-03-17 DIAGNOSIS — E1142 Type 2 diabetes mellitus with diabetic polyneuropathy: Secondary | ICD-10-CM | POA: Diagnosis not present

## 2021-04-07 DIAGNOSIS — L97512 Non-pressure chronic ulcer of other part of right foot with fat layer exposed: Secondary | ICD-10-CM | POA: Diagnosis not present

## 2021-04-07 DIAGNOSIS — E1142 Type 2 diabetes mellitus with diabetic polyneuropathy: Secondary | ICD-10-CM | POA: Diagnosis not present

## 2021-04-13 DIAGNOSIS — I10 Essential (primary) hypertension: Secondary | ICD-10-CM | POA: Diagnosis not present

## 2021-04-13 DIAGNOSIS — E1140 Type 2 diabetes mellitus with diabetic neuropathy, unspecified: Secondary | ICD-10-CM | POA: Diagnosis not present

## 2021-04-13 DIAGNOSIS — Z6828 Body mass index (BMI) 28.0-28.9, adult: Secondary | ICD-10-CM | POA: Diagnosis not present

## 2021-04-13 DIAGNOSIS — E119 Type 2 diabetes mellitus without complications: Secondary | ICD-10-CM | POA: Diagnosis not present

## 2021-04-13 DIAGNOSIS — E663 Overweight: Secondary | ICD-10-CM | POA: Diagnosis not present

## 2021-04-13 DIAGNOSIS — Z1331 Encounter for screening for depression: Secondary | ICD-10-CM | POA: Diagnosis not present

## 2021-04-28 DIAGNOSIS — L97512 Non-pressure chronic ulcer of other part of right foot with fat layer exposed: Secondary | ICD-10-CM | POA: Diagnosis not present

## 2021-04-28 DIAGNOSIS — E1142 Type 2 diabetes mellitus with diabetic polyneuropathy: Secondary | ICD-10-CM | POA: Diagnosis not present

## 2021-05-19 DIAGNOSIS — E1142 Type 2 diabetes mellitus with diabetic polyneuropathy: Secondary | ICD-10-CM | POA: Diagnosis not present

## 2021-06-01 DIAGNOSIS — Z681 Body mass index (BMI) 19 or less, adult: Secondary | ICD-10-CM | POA: Diagnosis not present

## 2021-06-01 DIAGNOSIS — E1140 Type 2 diabetes mellitus with diabetic neuropathy, unspecified: Secondary | ICD-10-CM | POA: Diagnosis not present

## 2021-06-16 DIAGNOSIS — E1142 Type 2 diabetes mellitus with diabetic polyneuropathy: Secondary | ICD-10-CM | POA: Diagnosis not present

## 2021-06-16 DIAGNOSIS — B351 Tinea unguium: Secondary | ICD-10-CM | POA: Diagnosis not present

## 2021-07-06 IMAGING — MR MR FOOT*L* W/O CM
5 series · 40 of 40 positions shown · non-contrast
Comparison: None.

CLINICAL DATA: Second toe diabetic ulcer.

EXAM:
MRI OF THE LEFT FOOT WITHOUT CONTRAST
TECHNIQUE: Multiplanar, multisequence MR imaging of the left forefoot was
performed. No intravenous contrast was administered.

[Series 3: T1 · coronal · left · 3.0mm · 0.38mm/px · 11 of 40 slices shown (1 of 2)]
[im 1/40]
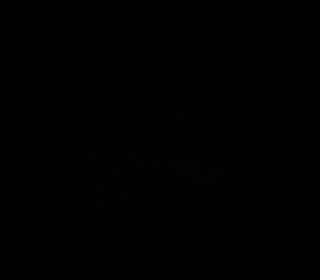
[im 4/40]
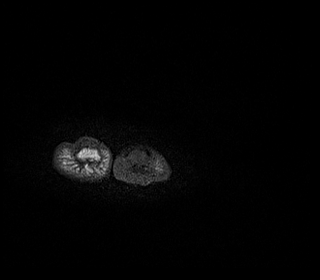
[im 8/40]
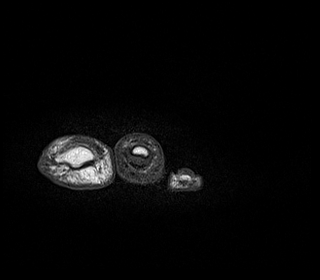
[im 12/40]
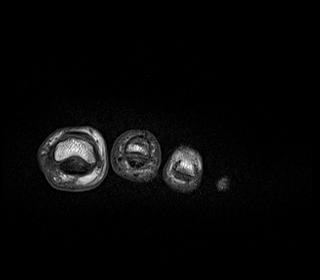
[im 16/40]
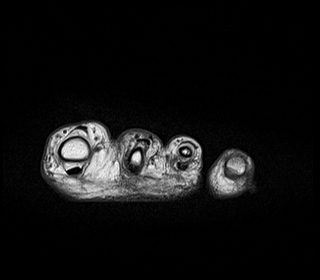
[im 20/40]
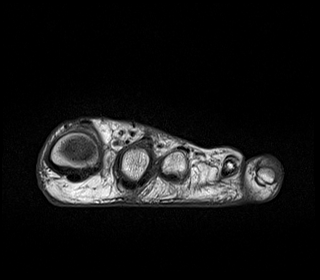
[im 24/40]
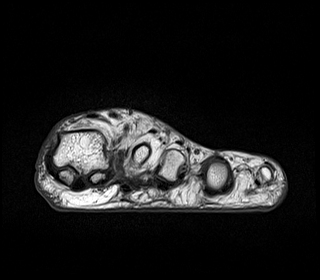
[im 28/40]
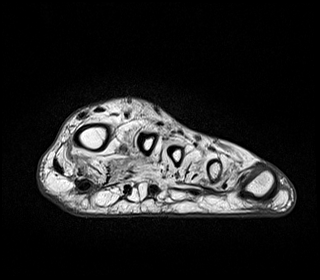
[im 32/40]
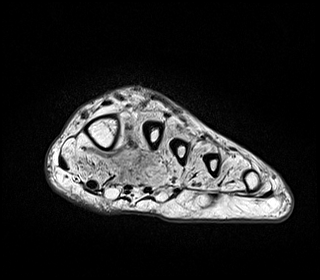
[im 36/40]
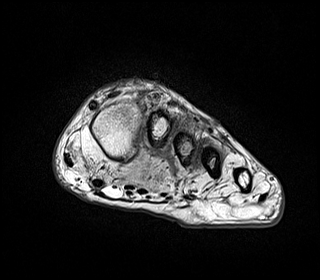
[im 40/40]
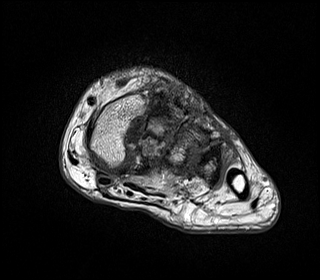

[Series 4: T2 fat-sat · coronal · left · 3.0mm · 0.38mm/px · 11 of 40 slices shown (1 of 2)]
[im 1/40]
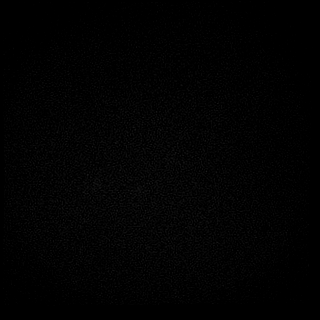
[im 4/40]
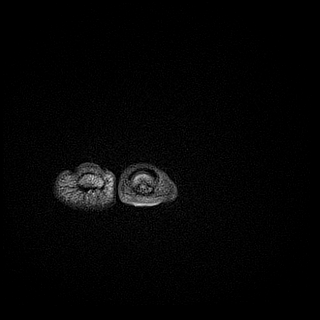
[im 8/40]
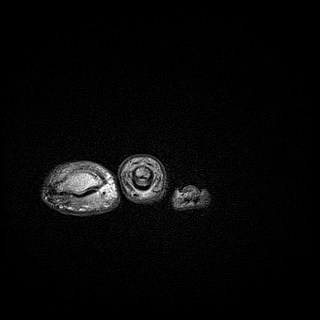
[im 12/40]
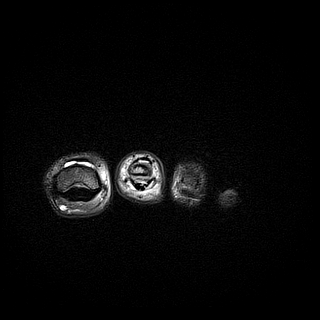
[im 16/40]
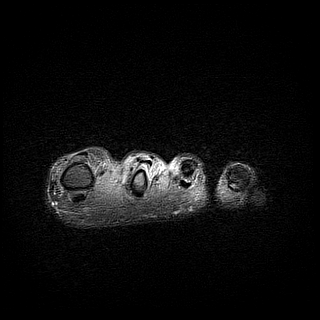
[im 20/40]
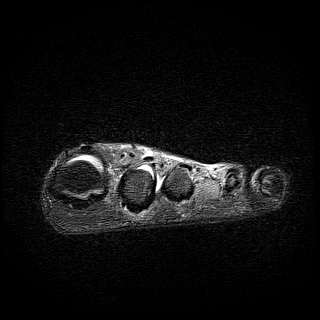
[im 24/40]
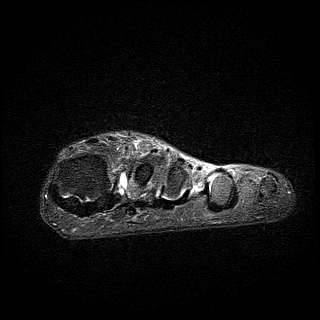
[im 28/40]
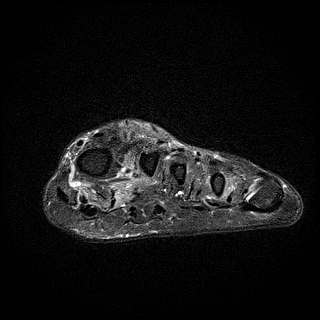
[im 32/40]
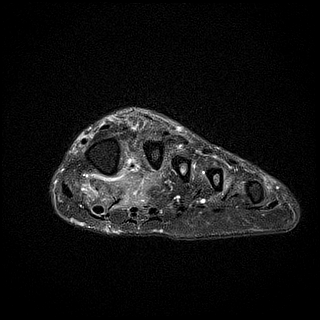
[im 36/40]
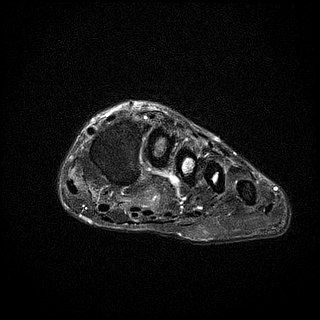
[im 40/40]
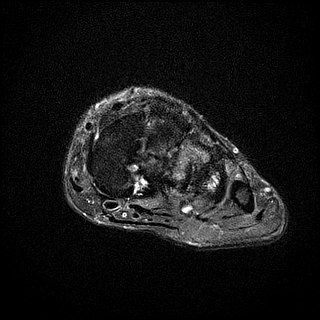

[Series 5: T1 · axial · left · 3.0mm · 0.62mm/px · z∈[-77,-4]mm · 5 of 20 slices shown (2 of 2)]
[im 1/20]
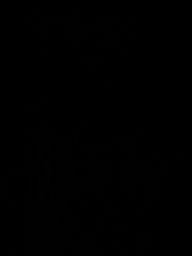
[im 5/20]
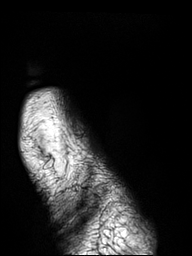
[im 10/20]
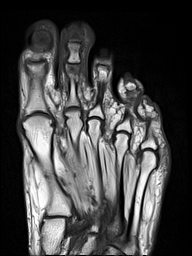
[im 15/20]
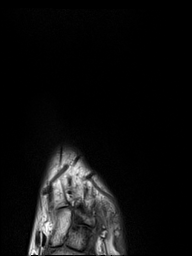
[im 20/20]
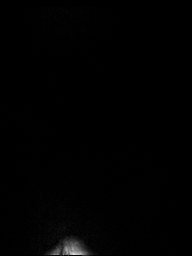

[Series 6: T2 fat-sat · axial · left · 3.0mm · 0.62mm/px · z∈[-73,+0]mm · 5 of 19 slices shown (2 of 2)]
[im 1/19]
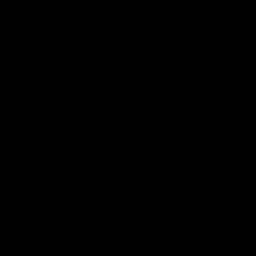
[im 5/19]
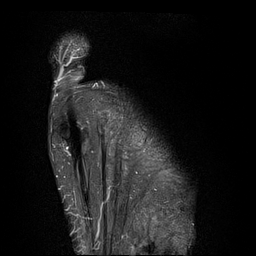
[im 10/19]
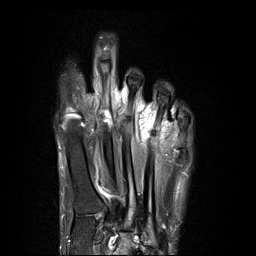
[im 14/19]
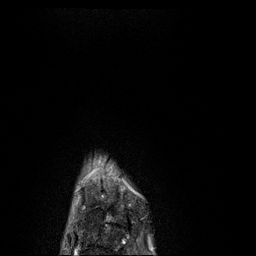
[im 19/19]
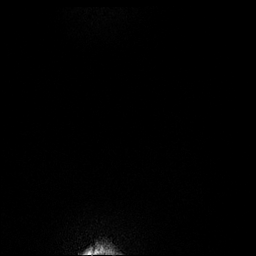

[Series 7: STIR · sagittal · left · 3.0mm · 0.62mm/px · 8 of 31 slices shown]
[im 1/31]
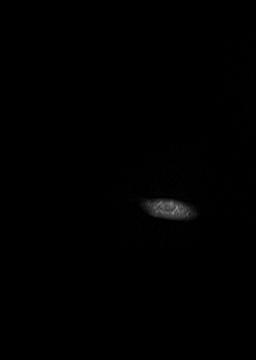
[im 5/31]
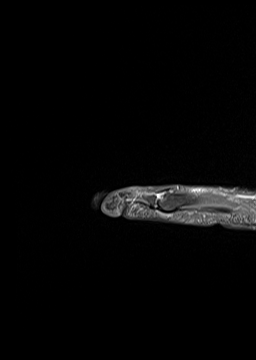
[im 9/31]
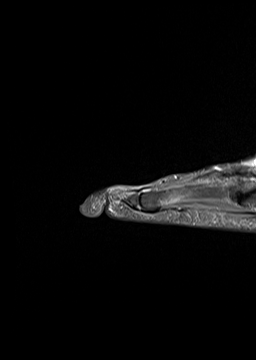
[im 13/31]
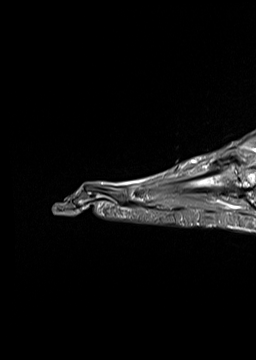
[im 18/31]
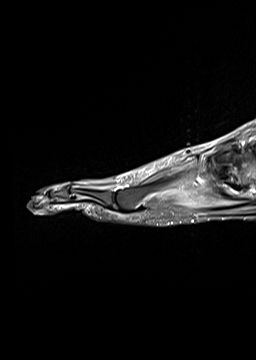
[im 22/31]
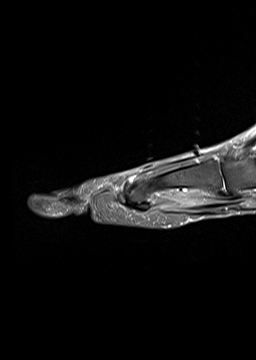
[im 26/31]
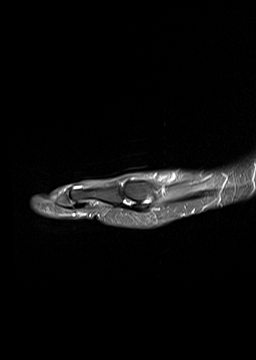
[im 31/31]
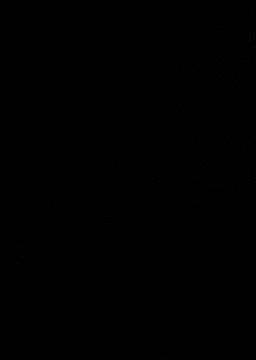

[40 of 40 positions shown; findings below may reference images not displayed]

FINDINGS: Bones/Joint/Cartilage

Abnormal marrow edema with corresponding decreased T1 marrow signal
involving the second distal phalanx, consistent with osteomyelitis.
No fracture or dislocation. Severe joint space narrowing of the
second through fourth TMT joints with bulky osteophytes, subchondral
cysts, and periarticular marrow edema. No joint effusion.

Ligaments

Collateral ligaments are intact.  Lisfranc ligament is intact.

Muscles and Tendons
Flexor and extensor tendons are intact. No tenosynovitis. Complete
atrophy of the intrinsic forefoot muscles.

Soft tissue
Skin ulceration at the tip of the second toe. No fluid collection or
hematoma. No soft tissue mass.
IMPRESSION: 1. Skin ulceration at the tip of the second toe with underlying
osteomyelitis of the second distal phalanx. No abscess.
2. Severe degenerative changes of the second through fourth TMT
joints, potentially reflecting neuropathic arthropathy.

## 2021-07-27 DIAGNOSIS — E1142 Type 2 diabetes mellitus with diabetic polyneuropathy: Secondary | ICD-10-CM | POA: Diagnosis not present

## 2021-07-27 DIAGNOSIS — I10 Essential (primary) hypertension: Secondary | ICD-10-CM | POA: Diagnosis not present

## 2021-07-27 DIAGNOSIS — E7849 Other hyperlipidemia: Secondary | ICD-10-CM | POA: Diagnosis not present

## 2021-07-27 DIAGNOSIS — F329 Major depressive disorder, single episode, unspecified: Secondary | ICD-10-CM | POA: Diagnosis not present

## 2021-08-15 DIAGNOSIS — L84 Corns and callosities: Secondary | ICD-10-CM | POA: Diagnosis not present

## 2021-08-15 DIAGNOSIS — E1142 Type 2 diabetes mellitus with diabetic polyneuropathy: Secondary | ICD-10-CM | POA: Diagnosis not present

## 2021-08-15 DIAGNOSIS — B351 Tinea unguium: Secondary | ICD-10-CM | POA: Diagnosis not present

## 2021-09-21 DIAGNOSIS — E782 Mixed hyperlipidemia: Secondary | ICD-10-CM | POA: Diagnosis not present

## 2021-09-21 DIAGNOSIS — Z6825 Body mass index (BMI) 25.0-25.9, adult: Secondary | ICD-10-CM | POA: Diagnosis not present

## 2021-09-21 DIAGNOSIS — G894 Chronic pain syndrome: Secondary | ICD-10-CM | POA: Diagnosis not present

## 2021-09-21 DIAGNOSIS — F329 Major depressive disorder, single episode, unspecified: Secondary | ICD-10-CM | POA: Diagnosis not present

## 2021-09-21 DIAGNOSIS — E663 Overweight: Secondary | ICD-10-CM | POA: Diagnosis not present

## 2021-09-21 DIAGNOSIS — I10 Essential (primary) hypertension: Secondary | ICD-10-CM | POA: Diagnosis not present

## 2021-09-21 DIAGNOSIS — Z23 Encounter for immunization: Secondary | ICD-10-CM | POA: Diagnosis not present

## 2021-09-21 DIAGNOSIS — Z1389 Encounter for screening for other disorder: Secondary | ICD-10-CM | POA: Diagnosis not present

## 2021-09-21 DIAGNOSIS — G600 Hereditary motor and sensory neuropathy: Secondary | ICD-10-CM | POA: Diagnosis not present

## 2021-09-21 DIAGNOSIS — E1142 Type 2 diabetes mellitus with diabetic polyneuropathy: Secondary | ICD-10-CM | POA: Diagnosis not present

## 2021-09-21 DIAGNOSIS — E119 Type 2 diabetes mellitus without complications: Secondary | ICD-10-CM | POA: Diagnosis not present

## 2021-09-21 DIAGNOSIS — E1140 Type 2 diabetes mellitus with diabetic neuropathy, unspecified: Secondary | ICD-10-CM | POA: Diagnosis not present

## 2021-11-09 DIAGNOSIS — Z6825 Body mass index (BMI) 25.0-25.9, adult: Secondary | ICD-10-CM | POA: Diagnosis not present

## 2021-11-09 DIAGNOSIS — E1142 Type 2 diabetes mellitus with diabetic polyneuropathy: Secondary | ICD-10-CM | POA: Diagnosis not present

## 2022-01-04 DIAGNOSIS — G894 Chronic pain syndrome: Secondary | ICD-10-CM | POA: Diagnosis not present

## 2022-01-30 DIAGNOSIS — L97522 Non-pressure chronic ulcer of other part of left foot with fat layer exposed: Secondary | ICD-10-CM | POA: Diagnosis not present

## 2022-01-30 DIAGNOSIS — E1142 Type 2 diabetes mellitus with diabetic polyneuropathy: Secondary | ICD-10-CM | POA: Diagnosis not present

## 2022-01-30 DIAGNOSIS — L84 Corns and callosities: Secondary | ICD-10-CM | POA: Diagnosis not present

## 2022-01-30 DIAGNOSIS — B351 Tinea unguium: Secondary | ICD-10-CM | POA: Diagnosis not present

## 2022-02-22 DIAGNOSIS — E1142 Type 2 diabetes mellitus with diabetic polyneuropathy: Secondary | ICD-10-CM | POA: Diagnosis not present

## 2022-02-22 DIAGNOSIS — L97521 Non-pressure chronic ulcer of other part of left foot limited to breakdown of skin: Secondary | ICD-10-CM | POA: Diagnosis not present

## 2022-02-23 DIAGNOSIS — Z6826 Body mass index (BMI) 26.0-26.9, adult: Secondary | ICD-10-CM | POA: Diagnosis not present

## 2022-02-23 DIAGNOSIS — I10 Essential (primary) hypertension: Secondary | ICD-10-CM | POA: Diagnosis not present

## 2022-02-23 DIAGNOSIS — E663 Overweight: Secondary | ICD-10-CM | POA: Diagnosis not present

## 2022-02-23 DIAGNOSIS — E11621 Type 2 diabetes mellitus with foot ulcer: Secondary | ICD-10-CM | POA: Diagnosis not present

## 2022-02-23 DIAGNOSIS — G894 Chronic pain syndrome: Secondary | ICD-10-CM | POA: Diagnosis not present

## 2022-02-23 DIAGNOSIS — E1142 Type 2 diabetes mellitus with diabetic polyneuropathy: Secondary | ICD-10-CM | POA: Diagnosis not present

## 2022-02-23 DIAGNOSIS — E782 Mixed hyperlipidemia: Secondary | ICD-10-CM | POA: Diagnosis not present

## 2022-03-15 DIAGNOSIS — L97521 Non-pressure chronic ulcer of other part of left foot limited to breakdown of skin: Secondary | ICD-10-CM | POA: Diagnosis not present

## 2022-03-15 DIAGNOSIS — E1142 Type 2 diabetes mellitus with diabetic polyneuropathy: Secondary | ICD-10-CM | POA: Diagnosis not present

## 2022-04-12 DIAGNOSIS — L97529 Non-pressure chronic ulcer of other part of left foot with unspecified severity: Secondary | ICD-10-CM | POA: Diagnosis not present

## 2022-04-12 DIAGNOSIS — E1142 Type 2 diabetes mellitus with diabetic polyneuropathy: Secondary | ICD-10-CM | POA: Diagnosis not present

## 2022-04-12 DIAGNOSIS — E1140 Type 2 diabetes mellitus with diabetic neuropathy, unspecified: Secondary | ICD-10-CM | POA: Diagnosis not present

## 2022-04-17 DIAGNOSIS — E1142 Type 2 diabetes mellitus with diabetic polyneuropathy: Secondary | ICD-10-CM | POA: Diagnosis not present

## 2022-04-17 DIAGNOSIS — B351 Tinea unguium: Secondary | ICD-10-CM | POA: Diagnosis not present

## 2022-04-17 DIAGNOSIS — L84 Corns and callosities: Secondary | ICD-10-CM | POA: Diagnosis not present

## 2022-04-27 DIAGNOSIS — F329 Major depressive disorder, single episode, unspecified: Secondary | ICD-10-CM | POA: Diagnosis not present

## 2022-04-27 DIAGNOSIS — E1142 Type 2 diabetes mellitus with diabetic polyneuropathy: Secondary | ICD-10-CM | POA: Diagnosis not present

## 2022-04-27 DIAGNOSIS — G600 Hereditary motor and sensory neuropathy: Secondary | ICD-10-CM | POA: Diagnosis not present

## 2022-06-21 DIAGNOSIS — G894 Chronic pain syndrome: Secondary | ICD-10-CM | POA: Diagnosis not present

## 2022-06-21 DIAGNOSIS — E1140 Type 2 diabetes mellitus with diabetic neuropathy, unspecified: Secondary | ICD-10-CM | POA: Diagnosis not present

## 2022-06-26 DIAGNOSIS — B351 Tinea unguium: Secondary | ICD-10-CM | POA: Diagnosis not present

## 2022-06-26 DIAGNOSIS — E1142 Type 2 diabetes mellitus with diabetic polyneuropathy: Secondary | ICD-10-CM | POA: Diagnosis not present

## 2022-06-26 DIAGNOSIS — L84 Corns and callosities: Secondary | ICD-10-CM | POA: Diagnosis not present

## 2022-07-26 DIAGNOSIS — Z6825 Body mass index (BMI) 25.0-25.9, adult: Secondary | ICD-10-CM | POA: Diagnosis not present

## 2022-07-26 DIAGNOSIS — F17200 Nicotine dependence, unspecified, uncomplicated: Secondary | ICD-10-CM | POA: Diagnosis not present

## 2022-07-26 DIAGNOSIS — E663 Overweight: Secondary | ICD-10-CM | POA: Diagnosis not present

## 2022-07-26 DIAGNOSIS — E1140 Type 2 diabetes mellitus with diabetic neuropathy, unspecified: Secondary | ICD-10-CM | POA: Diagnosis not present

## 2022-09-04 DIAGNOSIS — B351 Tinea unguium: Secondary | ICD-10-CM | POA: Diagnosis not present

## 2022-09-04 DIAGNOSIS — L84 Corns and callosities: Secondary | ICD-10-CM | POA: Diagnosis not present

## 2022-09-04 DIAGNOSIS — E1142 Type 2 diabetes mellitus with diabetic polyneuropathy: Secondary | ICD-10-CM | POA: Diagnosis not present

## 2022-09-20 DIAGNOSIS — E1140 Type 2 diabetes mellitus with diabetic neuropathy, unspecified: Secondary | ICD-10-CM | POA: Diagnosis not present

## 2022-09-20 DIAGNOSIS — G894 Chronic pain syndrome: Secondary | ICD-10-CM | POA: Diagnosis not present

## 2022-11-13 DIAGNOSIS — L84 Corns and callosities: Secondary | ICD-10-CM | POA: Diagnosis not present

## 2022-11-13 DIAGNOSIS — B351 Tinea unguium: Secondary | ICD-10-CM | POA: Diagnosis not present

## 2022-11-13 DIAGNOSIS — E1142 Type 2 diabetes mellitus with diabetic polyneuropathy: Secondary | ICD-10-CM | POA: Diagnosis not present

## 2022-11-13 DIAGNOSIS — L03116 Cellulitis of left lower limb: Secondary | ICD-10-CM | POA: Diagnosis not present

## 2022-11-27 DIAGNOSIS — E1142 Type 2 diabetes mellitus with diabetic polyneuropathy: Secondary | ICD-10-CM | POA: Diagnosis not present

## 2022-11-27 DIAGNOSIS — L03116 Cellulitis of left lower limb: Secondary | ICD-10-CM | POA: Diagnosis not present

## 2022-12-27 DIAGNOSIS — E1140 Type 2 diabetes mellitus with diabetic neuropathy, unspecified: Secondary | ICD-10-CM | POA: Diagnosis not present

## 2022-12-27 DIAGNOSIS — G894 Chronic pain syndrome: Secondary | ICD-10-CM | POA: Diagnosis not present

## 2023-01-22 DIAGNOSIS — D485 Neoplasm of uncertain behavior of skin: Secondary | ICD-10-CM | POA: Diagnosis not present

## 2023-01-22 DIAGNOSIS — B351 Tinea unguium: Secondary | ICD-10-CM | POA: Diagnosis not present

## 2023-01-22 DIAGNOSIS — E1142 Type 2 diabetes mellitus with diabetic polyneuropathy: Secondary | ICD-10-CM | POA: Diagnosis not present

## 2023-03-01 DIAGNOSIS — E663 Overweight: Secondary | ICD-10-CM | POA: Diagnosis not present

## 2023-03-01 DIAGNOSIS — Z6825 Body mass index (BMI) 25.0-25.9, adult: Secondary | ICD-10-CM | POA: Diagnosis not present

## 2023-03-01 DIAGNOSIS — G894 Chronic pain syndrome: Secondary | ICD-10-CM | POA: Diagnosis not present

## 2023-03-01 DIAGNOSIS — E1142 Type 2 diabetes mellitus with diabetic polyneuropathy: Secondary | ICD-10-CM | POA: Diagnosis not present

## 2023-04-02 DIAGNOSIS — D485 Neoplasm of uncertain behavior of skin: Secondary | ICD-10-CM | POA: Diagnosis not present

## 2023-04-02 DIAGNOSIS — B351 Tinea unguium: Secondary | ICD-10-CM | POA: Diagnosis not present

## 2023-04-02 DIAGNOSIS — E1142 Type 2 diabetes mellitus with diabetic polyneuropathy: Secondary | ICD-10-CM | POA: Diagnosis not present

## 2023-04-17 DIAGNOSIS — E663 Overweight: Secondary | ICD-10-CM | POA: Diagnosis not present

## 2023-04-17 DIAGNOSIS — E1142 Type 2 diabetes mellitus with diabetic polyneuropathy: Secondary | ICD-10-CM | POA: Diagnosis not present

## 2023-04-17 DIAGNOSIS — Z6826 Body mass index (BMI) 26.0-26.9, adult: Secondary | ICD-10-CM | POA: Diagnosis not present

## 2023-06-11 DIAGNOSIS — D485 Neoplasm of uncertain behavior of skin: Secondary | ICD-10-CM | POA: Diagnosis not present

## 2023-06-11 DIAGNOSIS — B351 Tinea unguium: Secondary | ICD-10-CM | POA: Diagnosis not present

## 2023-06-11 DIAGNOSIS — E1142 Type 2 diabetes mellitus with diabetic polyneuropathy: Secondary | ICD-10-CM | POA: Diagnosis not present

## 2023-06-13 DIAGNOSIS — F17200 Nicotine dependence, unspecified, uncomplicated: Secondary | ICD-10-CM | POA: Diagnosis not present

## 2023-06-13 DIAGNOSIS — E1142 Type 2 diabetes mellitus with diabetic polyneuropathy: Secondary | ICD-10-CM | POA: Diagnosis not present

## 2023-06-13 DIAGNOSIS — E11621 Type 2 diabetes mellitus with foot ulcer: Secondary | ICD-10-CM | POA: Diagnosis not present

## 2023-06-13 DIAGNOSIS — K219 Gastro-esophageal reflux disease without esophagitis: Secondary | ICD-10-CM | POA: Diagnosis not present

## 2023-07-02 NOTE — Progress Notes (Unsigned)
GI Office Note    Referring Provider: Avis Epley, PA* Primary Care Physician:  Ladon Applebaum  Primary Gastroenterologist: Gerrit Friends.Rourk, MD  Chief Complaint   Chief Complaint  Patient presents with   Nausea    Nausea and vomiting sometimes. Abdominal pain in the middle of stomach. Constipation    History of Present Illness   Victoria Mcdonald is a 52 y.o. female presenting today at the request of Avis Epley, [place]* for GERD and nausea.   Visit with PCP 06/13/2023 via telehealth.  Reported needing to take nausea medication every day and eating antacids daily despite taking omeprazole every morning.  Symptoms present for 6 months.  She was advised to increase her omeprazole to 40 mg once daily and placed GI referral.  Diagnosed in high school with IBS and recently having lots of constipation and bloating. She states she feels 9 months pregnant in the evenings. Has a BM twice per week.Bristol 1 and 5. She admits to not eating right a lot and eating fast food or eats nothing at all. Has not tried anything for her constipation. .   Has a child with disability so it makes eating right hard.   She is taking antacids frequently and recently increased omeprazole to BID within the last week. Has seen a little difference in her nausea with the increase in PPI.  She gets nausea to where she feels like she may throw up but has not. Initially though it was rebelsys but she has been on that almost 2 years. She takes Zofran or nausea (mostly early morning, sometimes in the evenings). Denies frequent NSAID use. Has woken up several times at night in the past due to acid regurgitation.   Abdominal pain is more in the umbilical region and has had intermittent back pain.   Used to have chronic foot ulcers. Goes tomorrow for her next A1c check with PCP.   Current Outpatient Medications  Medication Sig Dispense Refill   atorvastatin (LIPITOR) 40 MG tablet Take 40 mg by mouth  daily.     DULoxetine (CYMBALTA) 60 MG capsule Take 1 capsule by mouth daily.     glipiZIDE (GLUCOTROL XL) 10 MG 24 hr tablet   1   hydrochlorothiazide (HYDRODIURIL) 25 MG tablet Take by mouth. Patient takes 1/2 tab daily.     HYDROcodone-acetaminophen (NORCO) 7.5-325 MG tablet   0   KRILL OIL PO Take by mouth.     nortriptyline (PAMELOR) 75 MG capsule 75 mg.  4   omeprazole (PRILOSEC) 20 MG capsule Take 20 mg by mouth daily.     ondansetron (ZOFRAN) 4 MG tablet Take by mouth.     promethazine (PHENERGAN) 25 MG tablet Take 1 tablet by mouth daily.     RYBELSUS 14 MG TABS Take 1 tablet by mouth daily.     SYNJARDY XR 12.03-999 MG TB24 Take 1 tablet by mouth 2 (two) times daily.     ALPRAZolam (XANAX) 0.5 MG tablet 0.5 mg. (Patient not taking: Reported on 07/03/2023)  1   Ascorbic Acid (VITAMIN C) 100 MG tablet Take 100 mg by mouth daily. (Patient not taking: Reported on 07/03/2023)     No current facility-administered medications for this visit.    Past Medical History:  Diagnosis Date   Diabetes (HCC)    Fibromyalgia    HLD (hyperlipidemia)    Neuropathy     Past Surgical History:  Procedure Laterality Date   KNEE SURGERY  Family History  Problem Relation Age of Onset   Prostate cancer Father    Diabetes Other        Maternal    Allergies as of 07/03/2023   (Not on File)    Social History   Socioeconomic History   Marital status: Divorced    Spouse name: Not on file   Number of children: 2   Years of education: 12th   Highest education level: Not on file  Occupational History    Comment: Self-Employed  Tobacco Use   Smoking status: Every Day    Current packs/day: 1.00    Average packs/day: 1 pack/day for 30.0 years (30.0 ttl pk-yrs)    Types: Cigarettes   Smokeless tobacco: Never  Substance and Sexual Activity   Alcohol use: No   Drug use: No   Sexual activity: Not on file  Other Topics Concern   Not on file  Social History Narrative   Pt lives at home  with her family.   Caffeine Use: 1 cup of coffee daily.    Social Determinants of Health   Financial Resource Strain: Not on file  Food Insecurity: Not on file  Transportation Needs: Not on file  Physical Activity: Not on file  Stress: Not on file  Social Connections: Not on file  Intimate Partner Violence: Not on file     Review of Systems   Gen: Denies any fever, chills, fatigue, weight loss, lack of appetite.  CV: Denies chest pain, heart palpitations, peripheral edema, syncope.  Resp: Denies shortness of breath at rest or with exertion. Denies wheezing or cough.  GI: see HPI GU : Denies urinary burning, urinary frequency, urinary hesitancy MS: Denies joint pain, muscle weakness, cramps, or limitation of movement.  Derm: Denies rash, itching, dry skin Psych: Denies depression, anxiety, memory loss, and confusion Heme: Denies bruising, bleeding, and enlarged lymph nodes.  Physical Exam   BP 138/68 (BP Location: Right Arm, Patient Position: Sitting, Cuff Size: Normal)   Pulse (!) 111   Temp 98 F (36.7 C) (Temporal)   Ht 5\' 7"  (1.702 m)   Wt 162 lb 12.8 oz (73.8 kg)   SpO2 98%   BMI 25.50 kg/m   General:   Alert and oriented. Pleasant and cooperative. Well-nourished and well-developed.  Head:  Normocephalic and atraumatic. Eyes:  Without icterus, sclera clear and conjunctiva pink.  Ears:  Normal auditory acuity. Mouth:  No deformity or lesions, oral mucosa pink.  Lungs:  Clear to auscultation bilaterally. No wheezes, rales, or rhonchi. No distress.  Heart:  S1, S2 present without murmurs appreciated.  Abdomen:  +BS, soft, non-tender and non-distended. No HSM noted. No guarding or rebound. No masses appreciated. Rectal:  Deferred  Msk:  Symmetrical without gross deformities. Normal posture. Extremities:  Without edema. Neurologic:  Alert and  oriented x4;  grossly normal neurologically. Skin:  Intact without significant lesions or rashes.  Varicose veins noted to  bilateral lower extremities. Psych:  Alert and cooperative. Normal mood and affect.  Assessment   Victoria Mcdonald is a 52 y.o. female with a history of fibromyalgia and diabetes presenting today for evaluation of nausea/vomiting, reflux, and constipation with abdominal pain.  Nausea and GERD: Nausea has been going on for several months and has been needing Zofran twice daily.  Initially was started on omeprazole once daily and recently increased to twice daily and has noted some mild improvement in her symptoms.  She has not had any significant vomiting.  Denies frequent  NSAID use.  Unlikely that any of her medications are causing this issue as they are all chronic medications for her.  Nausea is likely secondary to acid reflux, constipation could also be contributing.  For now we will continue PPI twice daily and monitor for improvement of symptoms.  If symptoms do not improve by time of follow-up then we discussed potential EGD.  Constipation, bloating, mid abdominal pain: Patient reports history of chronic constipation since teenage years.  She has always had some intermittent abdominal pain however symptoms appear to be increasing over the last several months.  Stools have been a Bristol 1 and 5 on average.  She does admit to not eating a healthy/balanced diet as she frequently consumes fast food or quick meals.  Also with significant bloating that is worsening throughout the day.  I suspect that her abdominal pain as well as her bloating is secondary to her constipation, she has not tried any over-the-counter regimens to date.  Will start her on MiraLAX and advised high-fiber diet and will also check labs including TSH and celiac given the bloating.  Will plan to discuss colon cancer screening at follow-up after we have determine if we can improve her nausea and better control of constipation.  PLAN   Continue omeprazole 40 mg twice daily Continue Zofran as needed Miralax 17g once  daily. High-fiber diet GERD diet Ask PCP to get lab work (CBC, CMP, TSH) + celiac panel Will discuss colon cancer screening at next visit. Follow up in 2 months.    Brooke Bonito, MSN, FNP-BC, AGACNP-BC [facility]

## 2023-07-03 ENCOUNTER — Encounter: Payer: Self-pay | Admitting: Gastroenterology

## 2023-07-03 ENCOUNTER — Ambulatory Visit: Payer: BC Managed Care – PPO | Admitting: Gastroenterology

## 2023-07-03 VITALS — BP 138/68 | HR 111 | Temp 98.0°F | Ht 67.0 in | Wt 162.8 lb

## 2023-07-03 DIAGNOSIS — K219 Gastro-esophageal reflux disease without esophagitis: Secondary | ICD-10-CM | POA: Diagnosis not present

## 2023-07-03 DIAGNOSIS — R1033 Periumbilical pain: Secondary | ICD-10-CM

## 2023-07-03 DIAGNOSIS — K5904 Chronic idiopathic constipation: Secondary | ICD-10-CM

## 2023-07-03 DIAGNOSIS — R110 Nausea: Secondary | ICD-10-CM

## 2023-07-03 DIAGNOSIS — R140 Abdominal distension (gaseous): Secondary | ICD-10-CM

## 2023-07-03 NOTE — Patient Instructions (Addendum)
Continue your omeprazole 40 mg twice daily.  This is best taken 30 minutes prior to meals.  You should take 1 before breakfast and 1 before dinner.  Below are some general dietary recommendations in regards to reflux which with limited intake of these could also improve your nausea.  Continue using Zofran as needed.  Follow a GERD diet:  Avoid fried, fatty, greasy, spicy, citrus foods. Avoid caffeine and carbonated beverages. Avoid chocolate. Try eating 4-6 small meals a day rather than 3 large meals. Do not eat within 3 hours of laying down. Prop head of bed up on wood or bricks to create a 6 inch incline.  Please ask your primary care that they are getting the lab work for you to get a CBC, CMP, and TSH.  This is to help [place] delineate a cause for constipation.  I am providing you with a lab slip for celiac panel today for you to have done with your labs tomorrow.  This is to rule out gluten allergy/sensitivity as cause of your bloating.  Start taking MiraLAX 17g daily in 8 oz of fluid of your choice.  You can take this in the morning or in the evening either 1.  Please let me know how you are doing within a few weeks of doing this and if you are going more frequently.  Try your best to follow a high-fiber diet, this is known to be helpful for constipation.  You also may benefit from a magnesium supplement as this is also known to help with constipation.  We will follow-up in 2 months.  Please see the back of your paperwork today to set up your MyChart for [place] to communicate better.  It was a pleasure to see you today. I want to create trusting relationships with patients. If you receive a survey regarding your visit,  I greatly appreciate you taking time to fill this out on paper or through your MyChart. I value your feedback.  Brooke Bonito, MSN, FNP-BC, AGACNP-BC [facility]

## 2023-07-04 ENCOUNTER — Encounter: Payer: Self-pay | Admitting: Gastroenterology

## 2023-07-12 DIAGNOSIS — R1033 Periumbilical pain: Secondary | ICD-10-CM | POA: Diagnosis not present

## 2023-07-12 DIAGNOSIS — R110 Nausea: Secondary | ICD-10-CM | POA: Diagnosis not present

## 2023-07-12 DIAGNOSIS — R140 Abdominal distension (gaseous): Secondary | ICD-10-CM | POA: Diagnosis not present

## 2023-07-26 DIAGNOSIS — F329 Major depressive disorder, single episode, unspecified: Secondary | ICD-10-CM | POA: Diagnosis not present

## 2023-07-26 DIAGNOSIS — F17200 Nicotine dependence, unspecified, uncomplicated: Secondary | ICD-10-CM | POA: Diagnosis not present

## 2023-07-26 DIAGNOSIS — E1142 Type 2 diabetes mellitus with diabetic polyneuropathy: Secondary | ICD-10-CM | POA: Diagnosis not present

## 2023-07-26 DIAGNOSIS — E782 Mixed hyperlipidemia: Secondary | ICD-10-CM | POA: Diagnosis not present

## 2023-07-26 DIAGNOSIS — K219 Gastro-esophageal reflux disease without esophagitis: Secondary | ICD-10-CM | POA: Diagnosis not present

## 2023-07-26 DIAGNOSIS — E11621 Type 2 diabetes mellitus with foot ulcer: Secondary | ICD-10-CM | POA: Diagnosis not present

## 2023-07-26 DIAGNOSIS — I10 Essential (primary) hypertension: Secondary | ICD-10-CM | POA: Diagnosis not present

## 2023-07-26 DIAGNOSIS — Z6825 Body mass index (BMI) 25.0-25.9, adult: Secondary | ICD-10-CM | POA: Diagnosis not present

## 2023-08-08 ENCOUNTER — Encounter: Payer: Self-pay | Admitting: Gastroenterology

## 2023-09-10 DIAGNOSIS — E1142 Type 2 diabetes mellitus with diabetic polyneuropathy: Secondary | ICD-10-CM | POA: Diagnosis not present

## 2023-09-10 DIAGNOSIS — I10 Essential (primary) hypertension: Secondary | ICD-10-CM | POA: Diagnosis not present

## 2023-09-17 DIAGNOSIS — D485 Neoplasm of uncertain behavior of skin: Secondary | ICD-10-CM | POA: Diagnosis not present

## 2023-09-17 DIAGNOSIS — E1142 Type 2 diabetes mellitus with diabetic polyneuropathy: Secondary | ICD-10-CM | POA: Diagnosis not present

## 2023-09-17 DIAGNOSIS — B351 Tinea unguium: Secondary | ICD-10-CM | POA: Diagnosis not present

## 2024-06-16 ENCOUNTER — Other Ambulatory Visit (HOSPITAL_COMMUNITY): Payer: Self-pay | Admitting: Podiatry

## 2024-06-16 DIAGNOSIS — M869 Osteomyelitis, unspecified: Secondary | ICD-10-CM

## 2024-06-17 ENCOUNTER — Ambulatory Visit (HOSPITAL_COMMUNITY)
Admission: RE | Admit: 2024-06-17 | Discharge: 2024-06-17 | Disposition: A | Discharge status: Home / Self Care | Source: Ambulatory Visit | Attending: Podiatry | Admitting: Podiatry

## 2024-06-17 DIAGNOSIS — M869 Osteomyelitis, unspecified: Secondary | ICD-10-CM | POA: Diagnosis present
